# Patient Record
Sex: Male | Born: 1955 | Race: White | Hispanic: No | Smoking: Current every day smoker
Health system: Southern US, Community
[De-identification: ages and names within clinical notes are randomized; demographics above are authoritative.]

## PROBLEM LIST (undated history)

## (undated) DIAGNOSIS — Z972 Presence of dental prosthetic device (complete) (partial): Secondary | ICD-10-CM

## (undated) DIAGNOSIS — I252 Old myocardial infarction: Secondary | ICD-10-CM

## (undated) DIAGNOSIS — N503 Cyst of epididymis: Secondary | ICD-10-CM

## (undated) DIAGNOSIS — Z86010 Personal history of colonic polyps: Secondary | ICD-10-CM

## (undated) DIAGNOSIS — Z955 Presence of coronary angioplasty implant and graft: Secondary | ICD-10-CM

## (undated) DIAGNOSIS — I255 Ischemic cardiomyopathy: Secondary | ICD-10-CM

## (undated) DIAGNOSIS — I10 Essential (primary) hypertension: Secondary | ICD-10-CM

## (undated) DIAGNOSIS — E785 Hyperlipidemia, unspecified: Secondary | ICD-10-CM

## (undated) DIAGNOSIS — I444 Left anterior fascicular block: Secondary | ICD-10-CM

## (undated) DIAGNOSIS — R0989 Other specified symptoms and signs involving the circulatory and respiratory systems: Secondary | ICD-10-CM

## (undated) DIAGNOSIS — J410 Simple chronic bronchitis: Secondary | ICD-10-CM

## (undated) DIAGNOSIS — J439 Emphysema, unspecified: Secondary | ICD-10-CM

## (undated) DIAGNOSIS — N138 Other obstructive and reflux uropathy: Secondary | ICD-10-CM

## (undated) DIAGNOSIS — I2510 Atherosclerotic heart disease of native coronary artery without angina pectoris: Secondary | ICD-10-CM

## (undated) DIAGNOSIS — Z860101 Personal history of adenomatous and serrated colon polyps: Secondary | ICD-10-CM

## (undated) DIAGNOSIS — N401 Enlarged prostate with lower urinary tract symptoms: Secondary | ICD-10-CM

## (undated) DIAGNOSIS — N529 Male erectile dysfunction, unspecified: Secondary | ICD-10-CM

## (undated) DIAGNOSIS — R3912 Poor urinary stream: Secondary | ICD-10-CM

## (undated) DIAGNOSIS — K08109 Complete loss of teeth, unspecified cause, unspecified class: Secondary | ICD-10-CM

## (undated) DIAGNOSIS — Z973 Presence of spectacles and contact lenses: Secondary | ICD-10-CM

## (undated) HISTORY — DX: Essential (primary) hypertension: I10

## (undated) HISTORY — PX: CORONARY ANGIOPLASTY WITH STENT PLACEMENT: SHX49

## (undated) HISTORY — DX: Ischemic cardiomyopathy: I25.5

## (undated) HISTORY — PX: CARDIOVASCULAR STRESS TEST: SHX262

## (undated) HISTORY — DX: Hyperlipidemia, unspecified: E78.5

## (undated) HISTORY — PX: CARDIAC CATHETERIZATION: SHX172

## (undated) HISTORY — DX: Other specified symptoms and signs involving the circulatory and respiratory systems: R09.89

## (undated) HISTORY — DX: Atherosclerotic heart disease of native coronary artery without angina pectoris: I25.10

---

## 1986-05-04 HISTORY — PX: LUMBAR LAMINECTOMY: SHX95

## 1998-01-20 ENCOUNTER — Emergency Department (HOSPITAL_COMMUNITY): Admission: EM | Admit: 1998-01-20 | Discharge: 1998-01-20 | Payer: Self-pay | Admitting: Emergency Medicine

## 1998-03-27 ENCOUNTER — Emergency Department (HOSPITAL_COMMUNITY): Admission: EM | Admit: 1998-03-27 | Discharge: 1998-03-27 | Payer: Self-pay | Admitting: Emergency Medicine

## 2007-12-01 DIAGNOSIS — R0989 Other specified symptoms and signs involving the circulatory and respiratory systems: Secondary | ICD-10-CM

## 2007-12-01 HISTORY — DX: Other specified symptoms and signs involving the circulatory and respiratory systems: R09.89

## 2008-01-06 ENCOUNTER — Encounter: Admission: RE | Admit: 2008-01-06 | Discharge: 2008-01-06 | Payer: Self-pay | Admitting: Cardiovascular Disease

## 2008-01-11 ENCOUNTER — Ambulatory Visit (HOSPITAL_COMMUNITY): Admission: RE | Admit: 2008-01-11 | Discharge: 2008-01-12 | Payer: Self-pay | Admitting: Cardiovascular Disease

## 2008-01-11 DIAGNOSIS — Z955 Presence of coronary angioplasty implant and graft: Secondary | ICD-10-CM

## 2008-01-11 HISTORY — DX: Presence of coronary angioplasty implant and graft: Z95.5

## 2009-01-14 DIAGNOSIS — I2510 Atherosclerotic heart disease of native coronary artery without angina pectoris: Secondary | ICD-10-CM

## 2009-01-14 HISTORY — DX: Atherosclerotic heart disease of native coronary artery without angina pectoris: I25.10

## 2010-05-01 IMAGING — CR DG CHEST 2V
2 series · 2 of 2 positions shown · non-contrast
Comparison: None

CLINICAL DATA: SOB; pre-procedure.  Smoker.

CHEST - 2 VIEW

[view not recorded (1 of 2)]
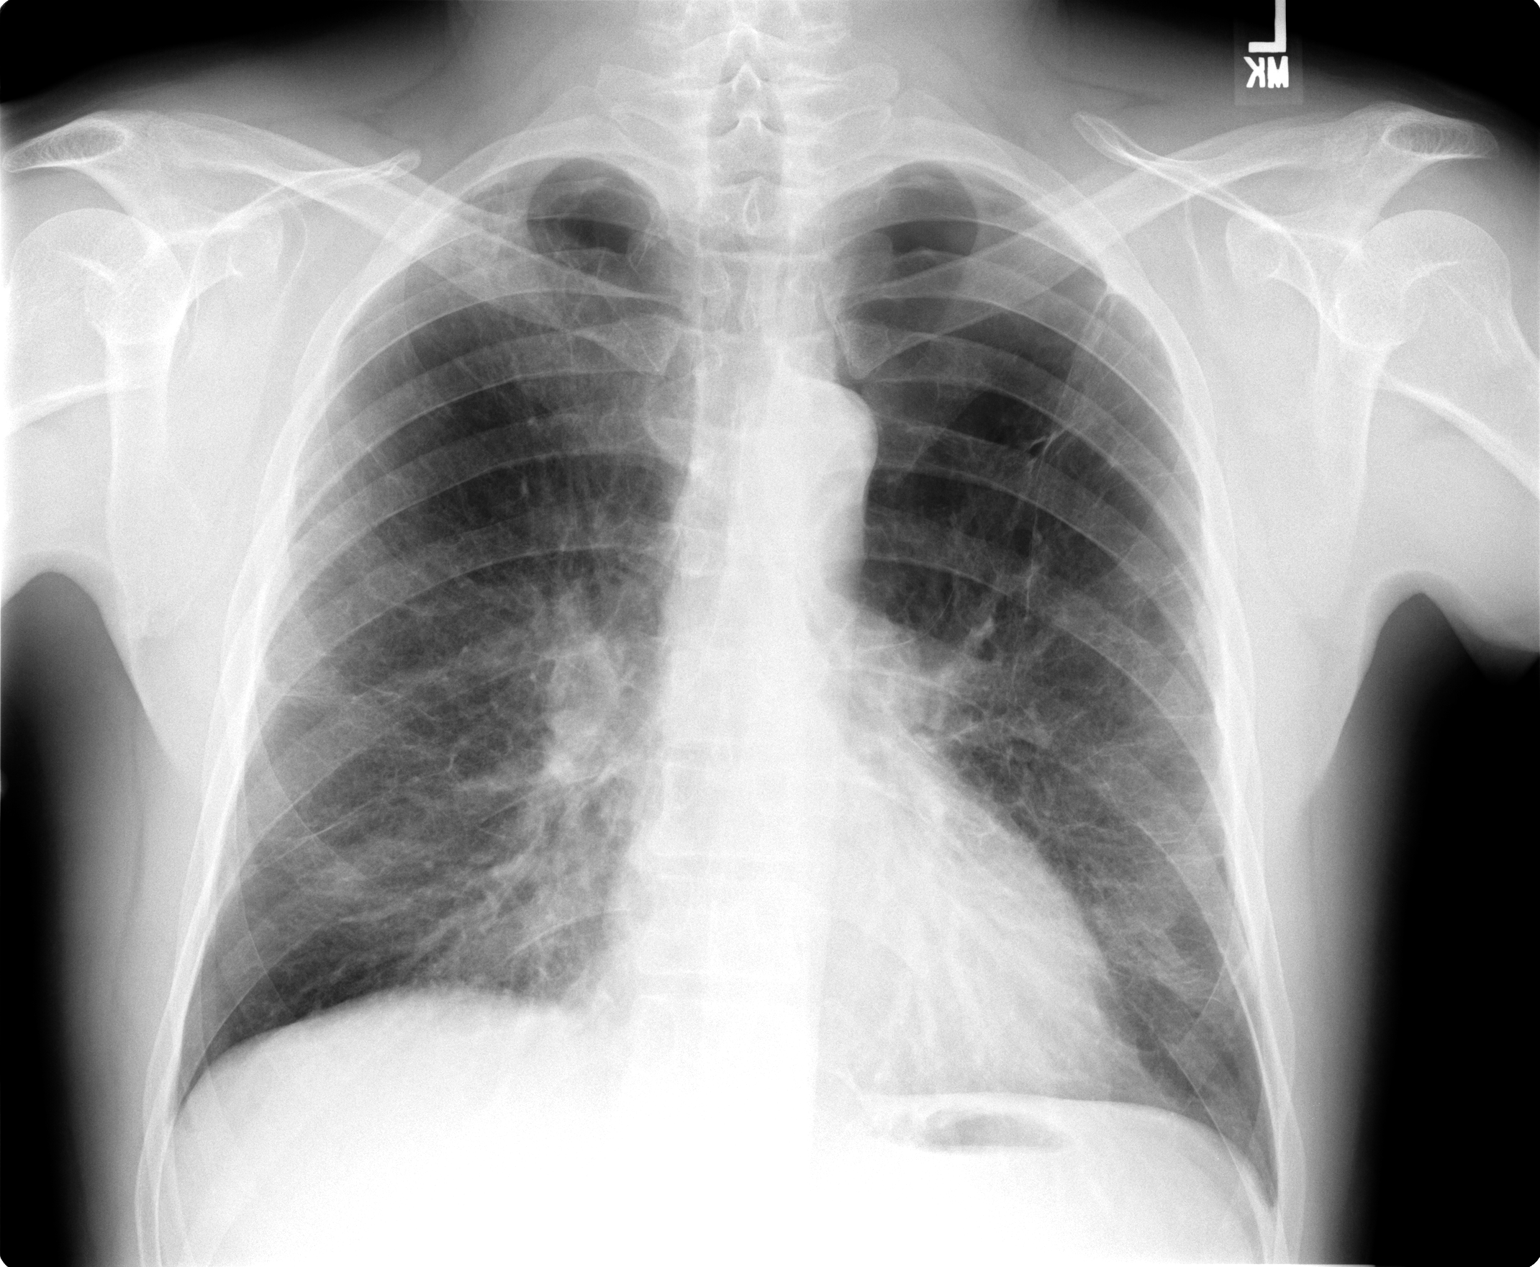

[view not recorded (2 of 2)]
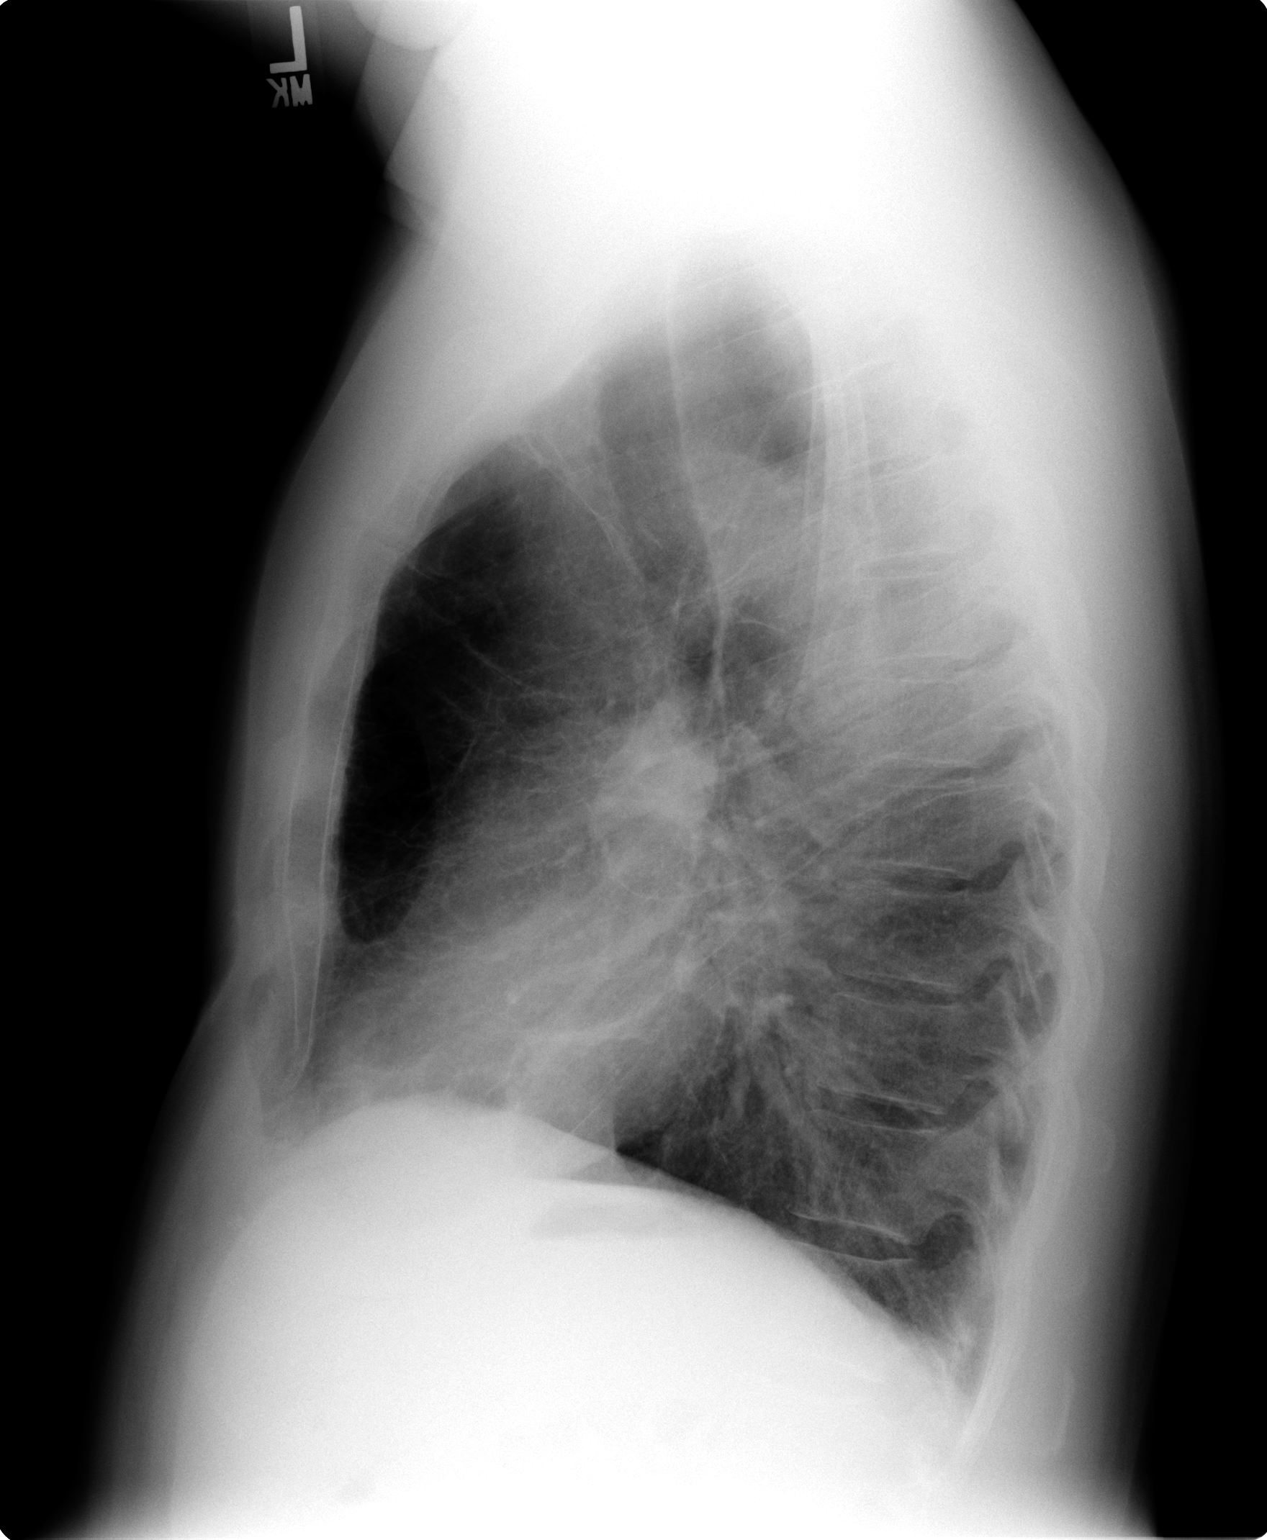

[2 of 2 positions shown; findings below may reference images not displayed]

FINDINGS: COPD/emphysema.  Linear scarring left upper lung zone.
Probable bullae in the left apex.  Cardiac size within normal
limits.  No acute chest findings.  Bony thorax intact
IMPRESSION: COPD/emphysema.

## 2010-09-16 NOTE — Discharge Summary (Signed)
NAME:  EDYN, QAZI NO.:  [id number]   MEDICAL RECORD NO.:  [id number]          PATIENT TYPE:  OIB   LOCATION:  6527                         FACILITY:  [facility]   PHYSICIAN:  Nanetta Batty, M.D.   DATE OF BIRTH:  [birth year]   DATE OF ADMISSION:  01/11/2008  DATE OF DISCHARGE:  01/12/2008                               DISCHARGE SUMMARY   DISCHARGE DIAGNOSES:  1. Coronary artery disease.  Apparently, Mr. Shakai Dolley was seen      as an outpatient by Dr. Nanetta Batty, referred by Dr. Raquel James,      his primary care doctor for an abnormal EKG.  He went on to have a      Myoview test which was positive for inferior lateral ischemia.  He      went on to undergo a cardiac catheterization and he had positive      coronary artery disease as an outpatient.  He has been totally      asymptomatic.  He came into the hospital on January 11, 2008, for      elective PCI.  He had a Promus 2.5 x 12 mm stent placed to his mid      circumflex with results of 90% stenosis decreased to 0 and he had a      Promus stent 2.5 x 15 placed to his proximal RCA, had decreased      from 80% to 0.  He also had a PCI to his OM-1, decreased less than      20%.  2. Hyperlipidemia.  3. Hypertension.  4. Tobacco smoking.  5. Chronic obstructive pulmonary disease/emphysema noted on chest x-      ray on January 06, 2008.   LABORATORY DATA:  Hemoglobin 13.9, hematocrit 41, WBCs 9.7, and  platelets 261.  Sodium 139, potassium 4.0, chloride was 68, CO2 of 25,  BUN 15, creatinine 0.88, and glucose 97.  CK-MB and troponin  postprocedure are negative.  Calcium was 8.5.  Chest x-ray on January 06, 2008, showed COPD and emphysema.   PROCEDURES:  On January 11, 2008, elective catheter with percutaneous  coronary intervention done by Dr. Nanetta Batty with results as above.   DISCHARGE MEDICATIONS:  1. Aspirin 81 mg 2 per day.  2. Simvastatin 20 mg at bedtime once a day.  3. Lisinopril 10 mg  once a day.  4. Plavix 75 mg once a day.  5. Coreg 3.125 mg twice a day.   He was told not to stop his Plavix.  He was also given a prescription  for nitroglycerin 1/150 one under tongue every 5 minutes x3 if needed  for chest pain.   HOSPITAL COURSE:  Mr. Haddix was admitted on January 11, 2008, for  elective PCI of known coronary artery disease, status post cardiac  catheterization as an outpatient at [facility].  He  subsequently underwent stenting to his mid circumflex with a Promus  stent 2.5 x 12 mm and stenting to his proximal RCA 2.5 x 15 mm.  Please  see Dr. Clayborne Dana complete dictation.  It also  appears he may  have had a PCI to an OM-1, reduced from 90 to less than 20.  Postprocedure, the morning of his discharge, his blood pressure was  139/80, heart rate was 93, temperature was 98.7, and room air sats is  94%.  Labs looked all appropriate.  He was seen by Dr. Nanetta Batty  and considered stable for discharge home.  He will be followed up as an  outpatient and seen by Dr. Allyson Sabal on January 17, 2008.  He was told not  to do any strenuous activity, lifting, pushing, pulling, or exercise or  extended walking for 1 week.  He should not return to work until seen by  Dr. Allyson Sabal and if he has any problems he will call our  office.  It is recommended that his Coreg be increased as an outpatient.  He was seen by cardiac rehab.  He was referred to cardiac rehab phase II  and he was seen by the smoking cessation nurse who spoke to him about  Chantix, which he apparently has at home but has not begun to take it.      Lezlie Octave, N.P.      Nanetta Batty, M.D.  Electronically Signed    BB/MEDQ  D:  01/12/2008  T:  01/13/2008  Job:  [id number]   cc:   Ursula Beath, MD

## 2010-09-16 NOTE — Cardiovascular Report (Signed)
NAME:  Tyrone Stanton, Tyrone Stanton NO.:  [id number]   MEDICAL RECORD NO.:  [id number]          PATIENT TYPE:  OIB   LOCATION:  6527                         FACILITY:  [facility]   PHYSICIAN:  Nanetta Batty, M.D.   DATE OF BIRTH:  09/25/1955   DATE OF PROCEDURE:  01/11/2008  DATE OF DISCHARGE:                            CARDIAC CATHETERIZATION   Mr. Kutz is a very pleasant 55 year old recently separated Caucasian  male with no children who works as a Charity fundraiser.  He was  initially referred by Dr. Raquel James for cardiovascular evaluation because  of an abnormal EKG and auscultated bruit.   His cardiac risk factor profile is positive for 50-70-pack-year history  of tobacco abuse.  His father did die of an MI at age 76.  The patient  has never had a heart attack or stroke.  He is relatively asymptomatic.  A Myoview showed anteroapical scar with mild inferior ischemia.  Cath at  [facility] did show 3-vessel disease with 60% segmental mid  LAD, which was calcified, 80-90% mid circumflex, and 90% ostial dominant  right with an EF of 40-45%.  He did have a 70% proximal right renal  artery stenosis.  He presents now for PCI stenting of his circumflex and  right coronary artery with anticipated medical treatment of his LAD.   PROCEDURE DESCRIPTION:  The patient was brought to the second floor  [facility] in the postabsorptive state.  He was  premedicated with p.o. Valium and IV fentanyl.  His right groin was  prepped and shaved in usual sterile fashion.  A 1% Xylocaine was used  for local anesthesia.  A 7-French sheath was inserted into the right  femoral artery using standard Seldinger technique.  A 7-French XB 3.5  guiding catheter along with an 0.014 x 190 Asahi soft wire.  A 2.25 x 10  cutting balloon was used for initial atherectomy of the mid circumflex.  Visipaque dye was used for the entirety of the case.  Aortic pressures  were monitored  during the case.  The patient did receive 200 mcg of  intracoronary nitroglycerin several times during the case.  He was on  aspirin and Plavix and received an additional 300 mg of p.o. Plavix and  IV Pepcid.  He received Angiomax bolus with an ACT of 271.   Cutting balloon atherectomy was performed in the mid circumflex at  nominal pressures resulting in an excellent angiographic result.  Stenting was performed with a 2.5 x 12 Promus drug-eluting stent up to  14 atmospheres (2.75 mm with resulting reduction of 90% stenosis to 0%  residual).  There was some encroachment at the ostium of the small  first marginal branch.  The wire was then redirected down this branch,  and low-pressure inflation was performed with a 206 apex balloon  resulting in a favorable angiographic result and notes 20-30% residual  stenosis.   The dominant right was then approached with a 6-French no-torque guide,  0.014 x 190 Asahi soft wire.  The right was engaged, wired, and directly  stented with a 2.5  x 15 Promus at 14 atmospheres.  It was postdilated  with a 2.75 x 12 [place] Sprinter up to 14 atmospheres (2.8 mm) resulting in  reduction of 80-90% proximal dominant RCA stenosis to 0% residual.  The  patient tolerated the procedure well.  The guidewire and catheter  removed.  The sheath was sewn securely in place.  The patient left the  lab in stable condition.  Angiomax was turned off.  The sheath will be  removed in several hours.  The patient will be treated with aspirin and  Plavix, discharge to home in the morning, and will see me back in the  office in approximately 2 weeks in followup.  Dr. Ursula Beath was  notified of these results.      Nanetta Batty, M.D.  Electronically Signed     JB/MEDQ  D:  01/11/2008  T:  01/12/2008  Job:  [id number]   cc:   [facility]  [facility]  Murrell Redden, MD

## 2010-12-12 DIAGNOSIS — I255 Ischemic cardiomyopathy: Secondary | ICD-10-CM

## 2010-12-12 HISTORY — DX: Ischemic cardiomyopathy: I25.5

## 2011-02-04 LAB — BASIC METABOLIC PANEL
BUN: 15
CO2: 25
Calcium: 8.5
Chloride: 106
Creatinine, Ser: 0.88
GFR calc Af Amer: >60
GFR calc non Af Amer: >60
Glucose, Bld: 97
Potassium: 4
Sodium: 139

## 2011-02-04 LAB — CBC
HCT: 41
Hemoglobin: 13.9
MCHC: 33.8
MCV: 89.5
Platelets: 261
RBC: 4.59
RDW: 13.6
WBC: 9.7

## 2011-02-04 LAB — CARDIAC PANEL(CRET KIN+CKTOT+MB+TROPI)
CK, MB: 2
Relative Index: INVALID
Total CK: 49
Troponin I: 0.04

## 2012-07-03 ENCOUNTER — Encounter: Payer: Self-pay | Admitting: *Deleted

## 2012-08-02 ENCOUNTER — Encounter: Payer: Self-pay | Admitting: Internal Medicine

## 2012-10-27 ENCOUNTER — Encounter: Payer: Self-pay | Admitting: *Deleted

## 2012-10-27 ENCOUNTER — Other Ambulatory Visit: Payer: Self-pay | Admitting: *Deleted

## 2012-10-27 ENCOUNTER — Encounter: Payer: Self-pay | Admitting: Internal Medicine

## 2012-10-27 ENCOUNTER — Ambulatory Visit (INDEPENDENT_AMBULATORY_CARE_PROVIDER_SITE_OTHER): Payer: BC Managed Care – PPO | Admitting: Internal Medicine

## 2012-10-27 VITALS — BP 132/60 | HR 62 | Ht 72.0 in | Wt 185.3 lb

## 2012-10-27 DIAGNOSIS — E785 Hyperlipidemia, unspecified: Secondary | ICD-10-CM | POA: Insufficient documentation

## 2012-10-27 DIAGNOSIS — R0989 Other specified symptoms and signs involving the circulatory and respiratory systems: Secondary | ICD-10-CM

## 2012-10-27 DIAGNOSIS — I2510 Atherosclerotic heart disease of native coronary artery without angina pectoris: Secondary | ICD-10-CM

## 2012-10-27 DIAGNOSIS — I2589 Other forms of chronic ischemic heart disease: Secondary | ICD-10-CM

## 2012-10-27 DIAGNOSIS — I255 Ischemic cardiomyopathy: Secondary | ICD-10-CM | POA: Insufficient documentation

## 2012-10-27 DIAGNOSIS — I10 Essential (primary) hypertension: Secondary | ICD-10-CM

## 2012-10-27 MED ORDER — NITROGLYCERIN 0.4 MG SL SUBL: 0.4000 mg | SUBLINGUAL_TABLET | SUBLINGUAL | Status: DC | PRN

## 2012-10-27 MED ORDER — SIMVASTATIN 80 MG PO TABS: 80.0000 mg | ORAL_TABLET | Freq: Every day | ORAL | Status: DC

## 2012-10-27 MED ORDER — LISINOPRIL 20 MG PO TABS: 20.0000 mg | ORAL_TABLET | Freq: Every day | ORAL | Status: DC

## 2012-10-27 MED ORDER — CARVEDILOL 6.25 MG PO TABS: 6.2500 mg | ORAL_TABLET | Freq: Two times a day (BID) | ORAL | Status: DC

## 2012-10-27 MED ORDER — CLOPIDOGREL BISULFATE 75 MG PO TABS: 75.0000 mg | ORAL_TABLET | Freq: Every day | ORAL | Status: DC

## 2012-10-27 NOTE — Patient Instructions (Addendum)
Your physician recommends that you schedule a follow-up appointment in: 1 year.  Your physician has requested that you have en exercise stress myoview. For further information please visit [email or web address] Please follow instruction sheet, as given.  Your physician has requested that you have a carotid duplex. This test is an ultrasound of the carotid arteries in your neck. It looks at blood flow through these arteries that supply the brain with blood. Allow one hour for this exam. There are no restrictions or special instructions.  Your physician recommends that you return for lab work within the next month. You will need to be fasting for this bloodwork.  NMR with lipid, CMET

## 2012-10-27 NOTE — Progress Notes (Signed)
OFFICE NOTE  Chief Complaint:  Routine followup, DOT physical  Primary Care Physician: No primary provider on file.  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 He has a history of a large anteroapical infarct in the past with a low EF of less than 40%, actually 39% at his last stress test and New York Heart Association Class I-II symptoms. He continues to be generally asymptomatic and can do a high level of exercise. He is here today again for DOT physical and underwent exercise nuclear stress testing on December 12, 2010, with 8 minutes of exercise and 10 metabolic equivalents. The stress test demonstrated a moderate to severe infarct in the apical anterior, apical basal, inferoseptal, inferior, mid-inferoseptal, mid-inferior, and apical inferior regions, which is thought to be unchanged from his previous study. He describes no new cardiovascular symptoms. He is requesting another stress test per DOT guidelines.   Past Medical History  Diagnosis Date  . Myocardial infarction      Myoview stress test 12/2007 showed mild anterior scar and inferior ischemia  . Ischemic cardiomyopathy 12/12/2010    last Exercise stress test-EF 37% mod. to severe perfusion defect to infarct \\scar  with mild perinfarct ischemia -apical anterior,apical basal inferioseptal, basal inferior, mid inferoseptal, mid inferior and apical inferior regions. the post stess LV  normal.;New York Heart Assoc. class l and II symptoms  . Coronary artery disease 01/14/2009    last Echo-45-50%,LV normal  . Hypertension   . Dyslipidemia   . Carotid bruit 12/01/2007    asymptomatic ;Carotid dopler normal    Past Surgical History  Procedure Laterality Date  . Cardiac catheterization  12/29/2007    LAD-long segmental 50 to 60% after the first mod. diagonal branch btwn ST1 and ST2; Lge OM2 80% proximal; RCA,RV branch 80 to 90% mid; RCA gave off PDA and PLA  appeared   to be 90% at the origin   . Coronary angioplasty with stent placement  09/092009    Cutting balloon atherectomy Mid Circ 2.5x12 Promus; Ostium of the 1st marginal branch angoiplasty result 20-30%;RCA stented 2.5 x 15 Promus post dilated w/ 2.75x12 [place] Sprinter reduce80-90% proximal dominanat RCAto 0%     FAMHx:  Family History  Problem Relation Age of Onset  . Stroke Mother   . Cancer Mother   . Heart disease Father     SOCHx:   reports that he has been smoking Cigarettes.  He has a 60 pack-year smoking history. He has never used smokeless tobacco. He reports that he does not drink alcohol or use illicit drugs.  ALLERGIES:  No Known Allergies  ROS: A comprehensive review of systems was negative except for: Cardiovascular: positive for fatigue  HOME MEDS: Current Outpatient Prescriptions  Medication Sig Dispense Refill  . aspirin EC 81 MG tablet Take 81 mg by mouth daily.      . carvedilol (COREG) 6.25 MG tablet Take 1 tablet (6.25 mg total) by mouth 2 (two) times daily.  30 tablet  11  . clopidogrel (PLAVIX) 75 MG tablet Take 1 tablet (75 mg total) by mouth daily.  30 tablet  11  . lisinopril (PRINIVIL,ZESTRIL) 20 MG tablet Take 1 tablet (20 mg total) by mouth daily.  30 tablet  11  . nitroGLYCERIN (NITROSTAT) 0.4 MG SL tablet Place 1 tablet (0.4 mg total) under the tongue every 5 (five) minutes as needed for chest pain.  25 tablet  3  .  simvastatin (ZOCOR) 80 MG tablet Take 1 tablet (80 mg total) by mouth at bedtime.  30 tablet  11   No current facility-administered medications for this visit.    LABS/IMAGING: No results found for this or any previous visit (from the past 48 hour(s)). No results found.  VITALS: BP 132/60  Pulse 62  Ht 6' (1.829 m)  Wt 185 lb 4.8 oz (84.052 kg)  BMI 25.13 kg/m2  EXAM: General appearance: alert and no distress Neck: no adenopathy, no JVD, supple, symmetrical, trachea midline, thyroid not enlarged, symmetric, no tenderness/mass/nodules  and New right carotid bruit Lungs: clear to auscultation bilaterally Heart: regular rate and rhythm, S1, S2 normal, no murmur, click, rub or gallop Abdomen: soft, non-tender; bowel sounds normal; no masses,  no organomegaly Extremities: extremities normal, atraumatic, no cyanosis or edema Pulses: 2+ and symmetric Skin: Skin color, texture, turgor normal. No rashes or lesions Neurologic: Grossly normal  EKG: Normal sinus rhythm at 62, Q waves in 2,3 and aVF.  ASSESSMENT: 1. Ischemic cardiomyopathy, EF 37%, with inferior and apical scar, NYHA Class I symptoms 2. Hypertension 3. Hyperlipidemia 4. Continued tobacco abuse  PLAN: 1.   Mr. Tyrone Stanton continues to do fairly well and is not limited to his duties by heart failure.  Has been 2 years since his last stress test, and per DOT guidelines, he is due for a repeat.  There is also a newly discovered right carotid bruit today. I did review his records which indicated no significant carotid stenosis bilaterally by ultrasound in 2009. He will need a repeat carotid ultrasound today. In addition, would like to recheck a lipid profile and metabolic profile. We may need to uptitrate his cholesterol medications. We also had a lengthy conversation about smoking cessation, spending more than 3 minutes discussing ways that he could quit. I don't feel that he is ready to do that at this time, but offered many options to help him with that. He understands that this is in a large part responsible for the worsening of his carotid artery disease.  Plan is to see him annually and I will contact him with the results of his stress test and Dopplers.  Tyrone Nose, MD, [facility] Attending Cardiologist [facility]  HILTY,Tyrone Stanton 10/27/2012, 9:30 AM

## 2012-11-11 ENCOUNTER — Telehealth (HOSPITAL_COMMUNITY): Payer: Self-pay | Admitting: Internal Medicine

## 2012-11-11 NOTE — Telephone Encounter (Signed)
[employer] denied stress test for this patient's DOT physical. We submitted an appeal and [employer] upheld the denial since the patient is asymptomatic and his last stress test was less than 3 years ago. I called the patient to notify, he is aware we are cancelling due to insurance. I informed him that he may contact the health plan for a member initiated appeal and we will be happy to assist. I also informed him that it may help to contact his DOT physician who also may be able to provide assistance.

## 2012-11-17 ENCOUNTER — Encounter (HOSPITAL_COMMUNITY): Payer: BC Managed Care – PPO

## 2012-11-17 ENCOUNTER — Ambulatory Visit: Payer: Self-pay | Admitting: Family Medicine

## 2012-11-17 ENCOUNTER — Ambulatory Visit (HOSPITAL_COMMUNITY)
Admission: RE | Admit: 2012-11-17 | Discharge: 2012-11-17 | Disposition: A | Discharge status: Home / Self Care | Payer: BC Managed Care – PPO | Source: Ambulatory Visit | Attending: Internal Medicine | Admitting: Internal Medicine

## 2012-11-17 VITALS — BP 136/88 | HR 82 | Temp 97.7°F | Resp 18 | Ht 72.0 in | Wt 182.0 lb

## 2012-11-17 DIAGNOSIS — Z0289 Encounter for other administrative examinations: Secondary | ICD-10-CM

## 2012-11-17 DIAGNOSIS — R0989 Other specified symptoms and signs involving the circulatory and respiratory systems: Secondary | ICD-10-CM | POA: Insufficient documentation

## 2012-11-17 NOTE — Progress Notes (Signed)
Carotid Duplex Completed. °Tyrone Stanton ° °

## 2012-11-17 NOTE — Progress Notes (Signed)
Subjective:    Patient ID: Tyrone Stanton, male    DOB: 16-Jan-1956, 57 y.o.   MRN: [id number]  HPI Tyrone Stanton is a 57 y.o. male Here for DOT physical. Last card good until 12/17/12. 2 years.  Last card good for  Hx of CAD, s/p ptca in 12/2007: takes plavix, no bleeding problems.    Nuclear stress testing 12/12/10 - , unchanged from previous study (stress test demonstrated a moderate to severe infarct in the apical anterior, apical basal, inferoseptal, inferior, mid-inferoseptal, mid-inferior, and apical inferior regions, which is thought to be unchanged from his previous study).   01/2009 echo - EF 45-50%.   followed by cardiologist - Dr. Rennis Golden, - OV 10/27/12. Has not had repeat stress test due to cost. Told that insurance would not pay for stress test unless acute problem.  Carotid bruit on R noted by cardiologist - had doppler done this am.   No chest pain, no lightheadedness or dizziness with driving. Has not had to take NTG.   tob abuse - 1.5 ppd for 40 years. .  No other medical problems.   L testicle swelling - some increased recently - no hernia in past, but told by urologist few years ago this area was ok - plans to followup with urologist as area has increased in size.    Review of Systems  Constitutional: Negative for fever and chills.  Respiratory: Positive for shortness of breath (shortnes sof breath with smoking - if with exertion.  no recent changes. ). Negative for chest tightness.   Cardiovascular: Negative for chest pain, palpitations and leg swelling.  Neurological: Negative for dizziness, weakness, light-headedness and headaches.       Objective:   Physical Exam  Vitals reviewed. Constitutional: He is oriented to person, place, and time. He appears well-developed and well-nourished. No distress.  HENT:  Head: Normocephalic and atraumatic.  Right Ear: External ear normal.  Left Ear: External ear normal.  Mouth/Throat: Oropharynx is clear and  moist.  Eyes: Conjunctivae and EOM are normal. Pupils are equal, round, and reactive to light.  Neck: Trachea normal and normal range of motion. Neck supple. Carotid bruit is present (faint bruit on Right. ). No thyromegaly present.  Cardiovascular: Normal rate, regular rhythm, normal heart sounds and intact distal pulses.   Pulmonary/Chest: Effort normal and breath sounds normal. No respiratory distress. He has no wheezes. He has no rales.  Abdominal: Soft. He exhibits no distension. There is no tenderness. Hernia confirmed negative in the right inguinal area and confirmed negative in the left inguinal area.  Genitourinary: Prostate normal. Left testis shows swelling (enlarged L testicle,).  Musculoskeletal: Normal range of motion. He exhibits no edema and no tenderness.  Lymphadenopathy:    He has no cervical adenopathy.  Neurological: He is alert and oriented to person, place, and time. He has normal reflexes.  Skin: Skin is warm and dry.  Psychiatric: He has a normal mood and affect. His behavior is normal.   Repeat BP 136/88.     Assessment & Plan:  Tyrone Stanton is a 57 y.o. male   DOT physical -  Hx of CAD, MI by prior stress testing.  Most recent echo in 2010 - EF 45-50 %, and stress testing in 12/2010. Due for repeat stress testing.  Will also need clearance letter form cardiologist before clearance for maximum of 1 year card. No card given today d/t above. When returns with this documentation, I can complete paperwork which was held  today.   Hx of tobacco abuse, and DOE with climbing steep hills only. No cough/cough syncope or known hx of COPD.  O2 sat wnl. Restrictions to be determined as above.   L testicle swelling - told prior was normal ?spermatocoele.  To follow up with urologist as subjective increase in size.    Patient Instructions  We will need a recent stress test and clearance letter from cardiologist, including verification that your "ejection fraction" is  greater than 40%,  prior to completing clearance with maximum 1 year card.  We will also need a copy of your carotid artery test today.   follow up with urologist for the testicle swelling.

## 2012-11-17 NOTE — Patient Instructions (Signed)
We will need a recent stress test and clearance letter from cardiologist, including verification that your "ejection fraction" is greater than 40%,  prior to completing clearance with maximum 1 year card.  We will also need a copy of your carotid artery test today.   follow up with urologist for the testicle swelling.

## 2012-11-30 ENCOUNTER — Encounter: Payer: Self-pay | Admitting: *Deleted

## 2013-04-22 ENCOUNTER — Other Ambulatory Visit: Payer: Self-pay | Admitting: Internal Medicine

## 2013-04-24 NOTE — Telephone Encounter (Signed)
Rx was sent to pharmacy electronically. 

## 2013-06-10 ENCOUNTER — Other Ambulatory Visit: Payer: Self-pay | Admitting: Internal Medicine

## 2013-06-12 NOTE — Telephone Encounter (Signed)
Rx was sent to pharmacy electronically. 

## 2013-06-17 ENCOUNTER — Other Ambulatory Visit: Payer: Self-pay | Admitting: Internal Medicine

## 2013-06-19 NOTE — Telephone Encounter (Signed)
Rx was sent to pharmacy electronically. 

## 2013-10-17 ENCOUNTER — Other Ambulatory Visit: Payer: Self-pay | Admitting: Internal Medicine

## 2013-10-17 NOTE — Telephone Encounter (Signed)
Rx was sent to pharmacy electronically. 

## 2013-10-24 ENCOUNTER — Ambulatory Visit: Payer: BC Managed Care – PPO | Admitting: Internal Medicine

## 2013-10-28 ENCOUNTER — Other Ambulatory Visit: Payer: Self-pay | Admitting: Internal Medicine

## 2013-10-30 NOTE — Telephone Encounter (Signed)
Rx was sent to pharmacy electronically. 

## 2013-11-10 ENCOUNTER — Other Ambulatory Visit: Payer: Self-pay | Admitting: Internal Medicine

## 2013-11-10 NOTE — Telephone Encounter (Signed)
Rx refill sent to patient pharmacy   

## 2013-11-30 ENCOUNTER — Encounter: Payer: Self-pay | Admitting: Internal Medicine

## 2013-11-30 ENCOUNTER — Ambulatory Visit (INDEPENDENT_AMBULATORY_CARE_PROVIDER_SITE_OTHER): Payer: BC Managed Care – PPO | Admitting: Internal Medicine

## 2013-11-30 ENCOUNTER — Other Ambulatory Visit: Payer: Self-pay | Admitting: *Deleted

## 2013-11-30 VITALS — BP 136/78 | HR 59 | Ht 72.0 in | Wt 184.4 lb

## 2013-11-30 DIAGNOSIS — I2589 Other forms of chronic ischemic heart disease: Secondary | ICD-10-CM

## 2013-11-30 DIAGNOSIS — N528 Other male erectile dysfunction: Secondary | ICD-10-CM

## 2013-11-30 DIAGNOSIS — N529 Male erectile dysfunction, unspecified: Secondary | ICD-10-CM

## 2013-11-30 DIAGNOSIS — I255 Ischemic cardiomyopathy: Secondary | ICD-10-CM

## 2013-11-30 DIAGNOSIS — I25119 Atherosclerotic heart disease of native coronary artery with unspecified angina pectoris: Secondary | ICD-10-CM

## 2013-11-30 DIAGNOSIS — E785 Hyperlipidemia, unspecified: Secondary | ICD-10-CM

## 2013-11-30 DIAGNOSIS — I10 Essential (primary) hypertension: Secondary | ICD-10-CM

## 2013-11-30 DIAGNOSIS — Z79899 Other long term (current) drug therapy: Secondary | ICD-10-CM

## 2013-11-30 DIAGNOSIS — I2510 Atherosclerotic heart disease of native coronary artery without angina pectoris: Secondary | ICD-10-CM

## 2013-11-30 DIAGNOSIS — I209 Angina pectoris, unspecified: Secondary | ICD-10-CM

## 2013-11-30 DIAGNOSIS — N400 Enlarged prostate without lower urinary tract symptoms: Secondary | ICD-10-CM

## 2013-11-30 MED ORDER — SIMVASTATIN 80 MG PO TABS: ORAL_TABLET | ORAL | Status: DC

## 2013-11-30 MED ORDER — CLOPIDOGREL BISULFATE 75 MG PO TABS: ORAL_TABLET | ORAL | Status: DC

## 2013-11-30 MED ORDER — NITROGLYCERIN 0.4 MG SL SUBL: 0.4000 mg | SUBLINGUAL_TABLET | SUBLINGUAL | Status: DC | PRN

## 2013-11-30 MED ORDER — CARVEDILOL 6.25 MG PO TABS: ORAL_TABLET | ORAL | Status: DC

## 2013-11-30 MED ORDER — SILDENAFIL CITRATE 50 MG PO TABS: 50.0000 mg | ORAL_TABLET | Freq: Every day | ORAL | Status: DC | PRN

## 2013-11-30 MED ORDER — LISINOPRIL 20 MG PO TABS: ORAL_TABLET | ORAL | Status: DC

## 2013-11-30 NOTE — Progress Notes (Signed)
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 He has a history of a large anteroapical infarct in the past with a low EF of less than 40%, actually 39% at his last stress test and New York Heart Association Class I-II symptoms. He continues to be generally asymptomatic and can do a high level of exercise. He is here today again for DOT physical and underwent exercise nuclear stress testing on December 12, 2010, with 8 minutes of exercise and 10 metabolic equivalents. The stress test demonstrated a moderate to severe infarct in the apical anterior, apical basal, inferoseptal, inferior, mid-inferoseptal, mid-inferior, and apical inferior regions, which is thought to be unchanged from his previous study. He describes no new cardiovascular symptoms. He is requesting another stress test per DOT guidelines.   Tyrone Stanton returns today for his annual visit. At his last office visit I recommended another stress test however it was declined by his insurance company. Probably because he is asymptomatic however it was requested per DOT guidelines which are not up-to-date. He continues to feel very well and does high level of work and exercise. He denies any chest pain or shortness of breath. We have not reassess his LV function and over 3 years and is due for repeat echocardiography. He is also not had a lipid assessment and over a year.  Past Medical History  Diagnosis Date  . Myocardial infarction      Myoview stress test 12/2007 showed mild anterior scar and inferior ischemia  . Ischemic cardiomyopathy 12/12/2010    last Exercise stress test-EF 37% mod. to severe perfusion defect to infarct \\scar  with mild perinfarct ischemia -apical anterior,apical basal inferioseptal, basal inferior, mid inferoseptal, mid inferior and  apical inferior regions. the post stess LV  normal.;New York Heart Assoc. class l and II symptoms  . Coronary artery disease 01/14/2009    last Echo-45-50%,LV normal  . Hypertension   . Dyslipidemia   . Carotid bruit 12/01/2007    asymptomatic ;Carotid dopler normal    Past Surgical History  Procedure Laterality Date  . Cardiac catheterization  12/29/2007    LAD-long segmental 50 to 60% after the first mod. diagonal branch btwn ST1 and ST2; Lge OM2 80% proximal; RCA,RV branch 80 to 90% mid; RCA gave off PDA and PLA  appeared  to be 90% at the origin   . Coronary angioplasty with stent placement  09/092009    Cutting balloon atherectomy Mid Circ 2.5x12 Promus; Ostium of the 1st marginal branch angoiplasty result 20-30%;RCA stented 2.5 x 15 Promus post dilated w/ 2.75x12 [place] Sprinter reduce80-90% proximal dominanat RCAto 0%     FAMHx:  Family History  Problem Relation Age of Onset  . Stroke Mother   . Cancer Mother   . Heart disease Father     SOCHx:   reports that he has been smoking Cigarettes.  He has a 60 pack-year smoking history. He has never used smokeless tobacco. He reports that he does not drink alcohol or use illicit drugs.  ALLERGIES:  No Known Allergies  ROS: A comprehensive review of systems was negative.  HOME MEDS: Current Outpatient Prescriptions  Medication Sig Dispense Refill  . aspirin EC 81 MG tablet Take 81 mg by mouth daily.      . carvedilol (COREG) 6.25 MG tablet TAKE 1 TABLET  BY MOUTH TWICE A DAY  180 tablet  3  . clopidogrel (PLAVIX) 75 MG tablet TAKE 1 TABLET BY MOUTH EVERY DAY  90 tablet  3  . lisinopril (PRINIVIL,ZESTRIL) 20 MG tablet TAKE 1 TABLET BY MOUTH DAILY  90 tablet  3  . nitroGLYCERIN (NITROSTAT) 0.4 MG SL tablet Place 1 tablet (0.4 mg total) under the tongue every 5 (five) minutes as needed for chest pain.  25 tablet  1  . simvastatin (ZOCOR) 80 MG tablet TAKE 1 TABLET BY MOUTH AT BEDTIME  90 tablet  3   No current facility-administered  medications for this visit.    LABS/IMAGING: No results found for this or any previous visit (from the past 48 hour(s)). No results found.  VITALS: BP 136/78  Pulse 59  Ht 6' (1.829 m)  Wt 184 lb 6.4 oz (83.643 kg)  BMI 25.00 kg/m2  EXAM: General appearance: alert and no distress Neck: no adenopathy, no JVD, supple, symmetrical, trachea midline, thyroid not enlarged, symmetric, no tenderness/mass/nodules and New right carotid bruit Lungs: clear to auscultation bilaterally Heart: regular rate and rhythm, S1, S2 normal, no murmur, click, rub or gallop Abdomen: soft, non-tender; bowel sounds normal; no masses,  no organomegaly Extremities: extremities normal, atraumatic, no cyanosis or edema Pulses: 2+ and symmetric Skin: Skin color, texture, turgor normal. No rashes or lesions Neurologic: Grossly normal  EKG: Sinus bradycardia at 59, old inferior infarct  ASSESSMENT: 1. Ischemic cardiomyopathy, EF 37%, with inferior and apical scar, NYHA Class I symptoms 2. Hypertension 3. Hyperlipidemia 4. Continued tobacco abuse 5. ED 6. BPH  PLAN: 1.   Tyrone Stanton unfortunately continues to smoke. This is an ongoing risk factor for him. His blood pressure is well controlled and is on appropriate heart failure medications. His EF has been assessed in over 3 years. I would recommend an echocardiogram based on current guidelines. He is cholesterol he is overdue for a reassessment. We will check that today. He continues to exercise at a fairly high level without any new chest pain which is reassuring. Plan to see him back annually or sooner if there is significant change on electrocardiogram.  Finally at the end of the visit today Tyrone Stanton reported is having problems with erectile dysfunction. He is interested in samples of Viagra. I was able to provide does however cautioned him against using them with nitrates. He is to use nitrates the last several years. He also reports a history of BPH. Will  go ahead and check a PSA along with his labs and I've asked him to make another appointment to see the urologist at [facility] who he had previously seen. He does not have a primary care provider and I strongly encouraged him to get one.  Tyrone NoseKenneth C. Hilty, MD, [facility] Attending Cardiologist [facility]  Stanton,Tyrone C 11/30/2013, 9:03 AM

## 2013-11-30 NOTE — Patient Instructions (Signed)
Your physician has requested that you have an echocardiogram. Echocardiography is a painless test that uses sound waves to create images of your heart. It provides your doctor with information about the size and shape of your heart and how well your heart's chambers and valves are working. This procedure takes approximately one hour. There are no restrictions for this procedure.  Your physician recommends that you return for lab work at your convenience. You will need to be fasting.   Your physician wants you to follow-up in: 1 year with Dr. Rennis GoldenHilty. You will receive a reminder letter in the mail two months in advance. If you don't receive a letter, please call our office to schedule the follow-up appointment.  Dr. Rennis GoldenHilty encourages you to establish with a primary care provider - [facility] in [place]

## 2013-11-30 NOTE — Telephone Encounter (Signed)
Rx was sent to pharmacy electronically. 

## 2013-12-08 ENCOUNTER — Telehealth (HOSPITAL_COMMUNITY): Payer: Self-pay | Admitting: *Deleted

## 2013-12-09 LAB — COMPREHENSIVE METABOLIC PANEL
ALT: 12 U/L (ref 0–53)
AST: 16 U/L (ref 0–37)
Albumin: 4.2 g/dL (ref 3.5–5.2)
Alkaline Phosphatase: 59 U/L (ref 39–117)
BUN: 9 mg/dL (ref 6–23)
CO2: 22 mEq/L (ref 19–32)
Calcium: 9.2 mg/dL (ref 8.4–10.5)
Chloride: 106 mEq/L (ref 96–112)
Creat: 0.75 mg/dL (ref 0.50–1.35)
Glucose, Bld: 102 mg/dL — ABNORMAL HIGH (ref 70–99)
Potassium: 4.5 mEq/L (ref 3.5–5.3)
Sodium: 138 mEq/L (ref 135–145)
Total Bilirubin: 0.5 mg/dL (ref 0.2–1.2)
Total Protein: 6.3 g/dL (ref 6.0–8.3)

## 2013-12-09 LAB — LIPID PANEL
Cholesterol: 138 mg/dL (ref 0–200)
HDL: 34 mg/dL — ABNORMAL LOW (ref >39)
LDL Cholesterol: 81 mg/dL (ref 0–99)
Total CHOL/HDL Ratio: 4.1 Ratio
Triglycerides: 115 mg/dL (ref <150)
VLDL: 23 mg/dL (ref 0–40)

## 2013-12-11 LAB — PSA: PSA: 1.84 ng/mL (ref <=4.00)

## 2013-12-12 ENCOUNTER — Encounter: Payer: Self-pay | Admitting: *Deleted

## 2013-12-19 ENCOUNTER — Other Ambulatory Visit (HOSPITAL_COMMUNITY): Payer: Self-pay | Admitting: *Deleted

## 2013-12-19 DIAGNOSIS — I429 Cardiomyopathy, unspecified: Secondary | ICD-10-CM

## 2014-01-11 ENCOUNTER — Other Ambulatory Visit (INDEPENDENT_AMBULATORY_CARE_PROVIDER_SITE_OTHER): Payer: BC Managed Care – PPO

## 2014-01-11 DIAGNOSIS — I2510 Atherosclerotic heart disease of native coronary artery without angina pectoris: Secondary | ICD-10-CM

## 2014-01-11 DIAGNOSIS — I2589 Other forms of chronic ischemic heart disease: Secondary | ICD-10-CM

## 2014-01-11 DIAGNOSIS — I429 Cardiomyopathy, unspecified: Secondary | ICD-10-CM

## 2014-03-13 ENCOUNTER — Other Ambulatory Visit: Payer: Self-pay

## 2014-03-13 MED ORDER — SILDENAFIL CITRATE 50 MG PO TABS: 50.0000 mg | ORAL_TABLET | Freq: Every day | ORAL | Status: DC | PRN

## 2014-03-13 NOTE — Telephone Encounter (Signed)
Rx was sent to pharmacy electronically. 

## 2014-05-23 ENCOUNTER — Other Ambulatory Visit: Payer: Self-pay | Admitting: Internal Medicine

## 2014-05-25 ENCOUNTER — Other Ambulatory Visit: Payer: Self-pay | Admitting: Internal Medicine

## 2014-11-13 ENCOUNTER — Other Ambulatory Visit: Payer: Self-pay | Admitting: Internal Medicine

## 2014-11-30 ENCOUNTER — Ambulatory Visit (INDEPENDENT_AMBULATORY_CARE_PROVIDER_SITE_OTHER): Payer: BLUE CROSS/BLUE SHIELD | Admitting: Internal Medicine

## 2014-11-30 ENCOUNTER — Encounter: Payer: Self-pay | Admitting: Internal Medicine

## 2014-11-30 ENCOUNTER — Telehealth: Payer: Self-pay | Admitting: *Deleted

## 2014-11-30 VITALS — BP 152/88 | HR 72 | Ht 72.0 in | Wt 190.6 lb

## 2014-11-30 DIAGNOSIS — I255 Ischemic cardiomyopathy: Secondary | ICD-10-CM | POA: Diagnosis not present

## 2014-11-30 DIAGNOSIS — Z79899 Other long term (current) drug therapy: Secondary | ICD-10-CM

## 2014-11-30 DIAGNOSIS — I2510 Atherosclerotic heart disease of native coronary artery without angina pectoris: Secondary | ICD-10-CM | POA: Diagnosis not present

## 2014-11-30 DIAGNOSIS — N400 Enlarged prostate without lower urinary tract symptoms: Secondary | ICD-10-CM | POA: Diagnosis not present

## 2014-11-30 DIAGNOSIS — I10 Essential (primary) hypertension: Secondary | ICD-10-CM

## 2014-11-30 DIAGNOSIS — N529 Male erectile dysfunction, unspecified: Secondary | ICD-10-CM | POA: Diagnosis not present

## 2014-11-30 DIAGNOSIS — I2583 Coronary atherosclerosis due to lipid rich plaque: Secondary | ICD-10-CM

## 2014-11-30 DIAGNOSIS — E785 Hyperlipidemia, unspecified: Secondary | ICD-10-CM

## 2014-11-30 MED ORDER — CARVEDILOL 12.5 MG PO TABS: 12.5000 mg | ORAL_TABLET | Freq: Two times a day (BID) | ORAL | Status: DC

## 2014-11-30 MED ORDER — SILDENAFIL CITRATE 20 MG PO TABS: 40.0000 mg | ORAL_TABLET | Freq: Every day | ORAL | Status: DC | PRN

## 2014-11-30 NOTE — Addendum Note (Signed)
Addended by: Lindell Spar on: 11/30/2014 10:17 AM   Modules accepted: Orders

## 2014-11-30 NOTE — Patient Instructions (Addendum)
Medication Instructions:   INCREASE carvedilol to 12.5mg  twice daily A prescription for sildenafil  tablets - take 2-3 tablets daily as needed has been sent to the pharmacy  Labwork:  Please have fasting lab work at [facility].   Testing/Procedures:  Echocardiogram in [place] in 1 year  Follow-Up:  1 year with Dr. Rennis Golden  Any Other Special Instructions Will Be Listed Below (If Applicable).  Dr. Rennis Golden has referred you to [name] with [facility]   Dr. Rennis Golden recommends that you contact [facility] to establish with a primary care provider.

## 2014-11-30 NOTE — Progress Notes (Signed)
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 He has a history of a large anteroapical infarct in the past with a low EF of less than 40%, actually 39% at his last stress test and New York Heart Association Class I-II symptoms. He continues to be generally asymptomatic and can do a high level of exercise. He is here today again for DOT physical and underwent exercise nuclear stress testing on December 12, 2010, with 8 minutes of exercise and 10 metabolic equivalents. The stress test demonstrated a moderate to severe infarct in the apical anterior, apical basal, inferoseptal, inferior, mid-inferoseptal, mid-inferior, and apical inferior regions, which is thought to be unchanged from his previous study. He describes no new cardiovascular symptoms. He is requesting another stress test per DOT guidelines.   Tyrone Stanton returns today for his annual visit. At his last office visit I recommended another stress test however it was declined by his insurance company. Probably because he is asymptomatic however it was requested per DOT guidelines which are not up-to-date. He continues to feel very well and does high level of work and exercise. He denies any chest pain or shortness of breath. We have not reassess his LV function and over 3 years and is due for repeat echocardiography. He is also not had a lipid assessment and over a year.  Tyrone Stanton returns today for follow-up. At his last office visit he had reassessment of LV function by echo which showed EF stable at 40%. Fortunately continues to smoke. 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 He continues to have problems with erectile dysfunction but seems to be well-controlled with Viagra. He is not  yet established a primary care provider. He is previously seen a urologist [name], but has not followed up.  Past Medical History  Diagnosis Date  . Myocardial infarction      Myoview stress test 12/2007 showed mild anterior scar and inferior ischemia  . Ischemic cardiomyopathy 12/12/2010    last Exercise stress test-EF 37% mod. to severe perfusion defect to infarct \\scar  with mild perinfarct ischemia -apical anterior,apical basal inferioseptal, basal inferior, mid inferoseptal, mid inferior and apical inferior regions. the post stess LV  normal.;New York Heart Assoc. class l and II symptoms  . Coronary artery disease 01/14/2009    last Echo-45-50%,LV normal  . Hypertension   . Dyslipidemia   . Carotid bruit 12/01/2007    asymptomatic ;Carotid dopler normal    Past Surgical History  Procedure Laterality Date  . Cardiac catheterization  12/29/2007    LAD-long segmental 50 to 60% after the first mod. diagonal branch btwn ST1 and ST2; Lge OM2 80% proximal; RCA,RV branch 80 to 90% mid; RCA gave off PDA and PLA  appeared  to be 90% at the origin   . Coronary angioplasty with stent placement  09/092009    Cutting balloon atherectomy Mid Circ 2.5x12 Promus; Ostium of the 1st marginal branch angoiplasty result 20-30%;RCA stented 2.5 x 15 Promus post dilated w/ 2.75x12 [place] Sprinter reduce80-90% proximal dominanat RCAto 0%     FAMHx:  Family History  Problem Relation Age of Onset  . Stroke Mother   . Cancer Mother   . Heart disease Father     SOCHx:  reports that he has been smoking Cigarettes.  He has a 60 pack-year smoking history. He has never used smokeless tobacco. He reports that he does not drink alcohol or use illicit drugs.  ALLERGIES:  No Known Allergies  ROS: A comprehensive review of systems was negative.  HOME MEDS: Current Outpatient Prescriptions  Medication Sig Dispense Refill  . aspirin EC 81 MG tablet Take 81 mg by mouth daily.    . carvedilol (COREG) 12.5 MG  tablet Take 1 tablet (12.5 mg total) by mouth 2 (two) times daily. 60 tablet 11  . clopidogrel (PLAVIX) 75 MG tablet TAKE 1 TABLET BY MOUTH EVERY DAY 90 tablet 3  . lisinopril (PRINIVIL,ZESTRIL) 20 MG tablet TAKE 1 TABLET BY MOUTH DAILY 90 tablet 3  . nitroGLYCERIN (NITROSTAT) 0.4 MG SL tablet Place 1 tablet (0.4 mg total) under the tongue every 5 (five) minutes as needed for chest pain. 25 tablet 1  . simvastatin (ZOCOR) 80 MG tablet TAKE 1 TABLET BY MOUTH AT BEDTIME 90 tablet 3  . sildenafil (REVATIO) 20 MG tablet Take 2-3 tablets (40-60 mg total) by mouth daily as needed. 30 tablet 1   No current facility-administered medications for this visit.    LABS/IMAGING: No results found for this or any previous visit (from the past 48 hour(s)). No results found.  VITALS: BP 152/88 mmHg  Pulse 72  Ht 6' (1.829 m)  Wt 190 lb 9.6 oz (86.456 kg)  BMI 25.84 kg/m2  EXAM: General appearance: alert and no distress Neck: no adenopathy, no JVD, supple, symmetrical, trachea midline, thyroid not enlarged, symmetric, no tenderness/mass/nodules and New right carotid bruit Lungs: clear to auscultation bilaterally Heart: regular rate and rhythm, S1, S2 normal, no murmur, click, rub or gallop Abdomen: soft, non-tender; bowel sounds normal; no masses,  no organomegaly Extremities: extremities normal, atraumatic, no cyanosis or edema Pulses: 2+ and symmetric Skin: Skin color, texture, turgor normal. No rashes or lesions Neurologic: Grossly normal  EKG: Normal sinus rhythm at 72  ASSESSMENT: 1. Ischemic cardiomyopathy, EF 40%, with inferior and apical scar, NYHA Class I symptoms 2. Hypertension 3. Hyperlipidemia 4. Continued tobacco abuse 5. ED 6. BPH  PLAN: 1.   Tyrone Stanton unfortunately continues to smoke. This is an ongoing risk factor for him. His blood pressure is well controlled and is on appropriate heart failure medications. His EF appears to be stable at 40%. There is room to up titrate  his medications and I like to increase his carvedilol to 12.5 mg twice a day. He is due for repeat cholesterol Stanton, as well as general screening labs and a PSA. He's requesting repeat pills of his Viagra. He needs to see his urologist and will refer him. I've also given him a referral to [facility] as he needs to establish with a primary care provider. We will recheck an echocardiogram prior to his next visit in one year.  Chrystie Nose, MD, [facility] Attending Cardiologist [facility]  Chrystie Nose 11/30/2014, 8:46 AM

## 2014-11-30 NOTE — Telephone Encounter (Signed)
Faxed prior authorization for sildenafil  tablets to [employer]-

## 2014-12-04 ENCOUNTER — Encounter: Payer: Self-pay | Admitting: Internal Medicine

## 2014-12-04 ENCOUNTER — Telehealth: Payer: Self-pay | Admitting: Internal Medicine

## 2014-12-04 ENCOUNTER — Other Ambulatory Visit: Payer: Self-pay | Admitting: *Deleted

## 2014-12-04 DIAGNOSIS — N400 Enlarged prostate without lower urinary tract symptoms: Secondary | ICD-10-CM

## 2014-12-04 DIAGNOSIS — N529 Male erectile dysfunction, unspecified: Secondary | ICD-10-CM

## 2014-12-04 MED ORDER — SILDENAFIL CITRATE 20 MG PO TABS: 40.0000 mg | ORAL_TABLET | Freq: Every day | ORAL | Status: DC | PRN

## 2014-12-04 NOTE — Telephone Encounter (Signed)
Spoke with [employer] rep - sildenafil  tablets is only on their formulary for [facility]  5-10mg  is approved can be used for ED (4 -  tabs/month) or BPH (1 -  tab daily)  cialis is on their formulary - may be a cost issue for patient as brand name viagra was changed to sildenafil   Will call patient to see if he would like Rx to go thru [facility] - if not, wil have to consult MD on dose change

## 2014-12-04 NOTE — Telephone Encounter (Signed)
[place]  Lindell Spar, RN            He is scheduled with Dr. Patsi Sears on 01-23-15. I mailed the appt date and details to him as he had no VM set up on his phone.   [employer]

## 2014-12-04 NOTE — Telephone Encounter (Signed)
Returning your call from today. °

## 2014-12-04 NOTE — Telephone Encounter (Signed)
Spoke with patient and informed him of [employer] medication [place] status. Offered to send Rx to [facility] - he would like this. Rx(s) sent to pharmacy electronically.  Informed him of urology appointment and informed him he will get a letter in the mail

## 2014-12-05 ENCOUNTER — Telehealth: Payer: Self-pay | Admitting: Internal Medicine

## 2014-12-05 NOTE — Telephone Encounter (Signed)
8/3 s/w patient and he needs to check with insurance company. States that insurance is suppose to be changing in Sept..2016. He wants to wait and call the office back.  Order was put in by Dr. Leone Payor for Echo to be done in the [facility].

## 2014-12-06 ENCOUNTER — Other Ambulatory Visit: Payer: Self-pay | Admitting: Internal Medicine

## 2014-12-06 NOTE — Telephone Encounter (Signed)
Rx(s) sent to pharmacy electronically.  

## 2014-12-06 NOTE — Telephone Encounter (Signed)
REFILL 

## 2015-01-07 ENCOUNTER — Other Ambulatory Visit: Payer: Self-pay | Admitting: Internal Medicine

## 2015-01-24 LAB — COMPREHENSIVE METABOLIC PANEL
ALT: 14 U/L (ref 9–46)
AST: 17 U/L (ref 10–35)
Albumin: 4 g/dL (ref 3.6–5.1)
Alkaline Phosphatase: 54 U/L (ref 40–115)
BUN: 14 mg/dL (ref 7–25)
CO2: 26 mmol/L (ref 20–31)
Calcium: 9.3 mg/dL (ref 8.6–10.3)
Chloride: 104 mmol/L (ref 98–110)
Creat: 0.76 mg/dL (ref 0.70–1.25)
Glucose, Bld: 100 mg/dL — ABNORMAL HIGH (ref 65–99)
Potassium: 4.6 mmol/L (ref 3.5–5.3)
Sodium: 139 mmol/L (ref 135–146)
Total Bilirubin: 0.5 mg/dL (ref 0.2–1.2)
Total Protein: 6.4 g/dL (ref 6.1–8.1)

## 2015-01-24 LAB — CBC
HCT: 45.8 % (ref 39.0–52.0)
Hemoglobin: 15.7 g/dL (ref 13.0–17.0)
MCH: 30.6 pg (ref 26.0–34.0)
MCHC: 34.3 g/dL (ref 30.0–36.0)
MCV: 89.3 fL (ref 78.0–100.0)
MPV: 9.9 fL (ref 8.6–12.4)
Platelets: 281 10*3/uL (ref 150–400)
RBC: 5.13 MIL/uL (ref 4.22–5.81)
RDW: 13.4 % (ref 11.5–15.5)
WBC: 7.7 10*3/uL (ref 4.0–10.5)

## 2015-01-24 LAB — LIPID PANEL
Cholesterol: 122 mg/dL — ABNORMAL LOW (ref 125–200)
HDL: 31 mg/dL — ABNORMAL LOW (ref >=40)
LDL Cholesterol: 71 mg/dL (ref <130)
Total CHOL/HDL Ratio: 3.9 Ratio (ref <=5.0)
Triglycerides: 99 mg/dL (ref <150)
VLDL: 20 mg/dL (ref <30)

## 2015-01-24 LAB — PSA: PSA: 1.57 ng/mL (ref <=4.00)

## 2015-03-23 ENCOUNTER — Other Ambulatory Visit: Payer: Self-pay | Admitting: Internal Medicine

## 2015-03-25 ENCOUNTER — Telehealth: Payer: Self-pay

## 2015-03-25 NOTE — Telephone Encounter (Signed)
Prior auth for Sildenafil 20mg  tabs [facility].

## 2015-03-27 ENCOUNTER — Telehealth: Payer: Self-pay

## 2015-03-27 ENCOUNTER — Other Ambulatory Visit: Payer: Self-pay | Admitting: Internal Medicine

## 2015-03-27 DIAGNOSIS — I429 Cardiomyopathy, unspecified: Secondary | ICD-10-CM

## 2015-03-27 NOTE — Telephone Encounter (Signed)
Prior auth for Sildenafil denied by [employer] Spoke with patient to advise him of this. He is already seeing a urologist and getting penile injections, so we will not pursue an appeal.

## 2015-04-18 ENCOUNTER — Other Ambulatory Visit: Payer: Self-pay

## 2015-04-18 ENCOUNTER — Ambulatory Visit (INDEPENDENT_AMBULATORY_CARE_PROVIDER_SITE_OTHER): Payer: 59

## 2015-04-18 DIAGNOSIS — I429 Cardiomyopathy, unspecified: Secondary | ICD-10-CM | POA: Diagnosis not present

## 2015-04-18 HISTORY — PX: TRANSTHORACIC ECHOCARDIOGRAM: SHX275

## 2015-11-22 ENCOUNTER — Encounter: Payer: Self-pay | Admitting: Internal Medicine

## 2015-11-22 ENCOUNTER — Ambulatory Visit (INDEPENDENT_AMBULATORY_CARE_PROVIDER_SITE_OTHER): Payer: 59 | Admitting: Internal Medicine

## 2015-11-22 VITALS — BP 134/82 | HR 64 | Ht 72.0 in | Wt 188.8 lb

## 2015-11-22 DIAGNOSIS — I2583 Coronary atherosclerosis due to lipid rich plaque: Secondary | ICD-10-CM

## 2015-11-22 DIAGNOSIS — Z1211 Encounter for screening for malignant neoplasm of colon: Secondary | ICD-10-CM | POA: Diagnosis not present

## 2015-11-22 DIAGNOSIS — I2510 Atherosclerotic heart disease of native coronary artery without angina pectoris: Secondary | ICD-10-CM

## 2015-11-22 DIAGNOSIS — I10 Essential (primary) hypertension: Secondary | ICD-10-CM

## 2015-11-22 DIAGNOSIS — Z720 Tobacco use: Secondary | ICD-10-CM | POA: Diagnosis not present

## 2015-11-22 DIAGNOSIS — F17200 Nicotine dependence, unspecified, uncomplicated: Secondary | ICD-10-CM

## 2015-11-22 DIAGNOSIS — E785 Hyperlipidemia, unspecified: Secondary | ICD-10-CM

## 2015-11-22 DIAGNOSIS — I255 Ischemic cardiomyopathy: Secondary | ICD-10-CM

## 2015-11-22 MED ORDER — SILDENAFIL CITRATE 20 MG PO TABS: 40.0000 mg | ORAL_TABLET | Freq: Every day | ORAL | Status: DC | PRN

## 2015-11-22 NOTE — Progress Notes (Signed)
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 He has a history of a large anteroapical infarct in the past with a low EF of less than 40%, actually 39% at his last stress test and New York Heart Association Class I-II symptoms. He continues to be generally asymptomatic and can do a high level of exercise. He is here today again for DOT physical and underwent exercise nuclear stress testing on December 12, 2010, with 8 minutes of exercise and 10 metabolic equivalents. The stress test demonstrated a moderate to severe infarct in the apical anterior, apical basal, inferoseptal, inferior, mid-inferoseptal, mid-inferior, and apical inferior regions, which is thought to be unchanged from his previous study. He describes no new cardiovascular symptoms. He is requesting another stress test per DOT guidelines.   Tyrone Stanton returns today for his annual visit. At his last office visit I recommended another stress test however it was declined by his insurance company. Probably because he is asymptomatic however it was requested per DOT guidelines which are not up-to-date. He continues to feel very well and does high level of work and exercise. He denies any chest pain or shortness of breath. We have not reassess his LV function and over 3 years and is due for repeat echocardiography. He is also not had a lipid assessment and over a year.  Tyrone Stanton returns today for follow-up. At his last office visit he had reassessment of LV function by echo which showed EF stable at 40%. Fortunately continues to smoke. 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 He continues to have problems with erectile dysfunction but seems to be well-controlled with Viagra. He is not  yet established a primary care provider. He is previously seen a urologist Dr. Brunilda PayorNesi, but has not followed up.  11/22/2015  Tyrone Stanton returns today for follow-up. Over the past year he's had no new complaints. He continues to work long days and has no problems with chest pain or worsening shortness of breath. He is in need of a refill of his Revatio. He is not establish with a primary care provider. I asked him whether he never had screening colonoscopy and he has not yet. I will therefore refer him for that. 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 He's not had repeat imaging since that time. He continues to smoke about 1-1/2 packs per day. I reviewed current screening guidelines which indicate a recommendation for a CT scan of the chest and smokers that are in their 60s. I'm recommending him to get a CT scan to screen for early malignancies.  Past Medical History  Diagnosis Date  . Myocardial infarction [facility](HCC)      Myoview stress test 12/2007 showed mild anterior scar and inferior ischemia  . Ischemic cardiomyopathy 12/12/2010    last Exercise stress test-EF 37% mod. to severe perfusion defect to infarct \\scar  with mild perinfarct ischemia -apical anterior,apical basal inferioseptal, basal inferior, mid inferoseptal, mid inferior and apical inferior regions. the post stess LV  normal.;New York Heart Assoc. class l and II symptoms  . Coronary artery disease 01/14/2009    last Echo-45-50%,LV normal  . Hypertension   . Dyslipidemia   . Carotid bruit  12/01/2007    asymptomatic ;Carotid dopler normal    Past Surgical History  Procedure Laterality Date  . Cardiac catheterization  12/29/2007    LAD-long segmental 50 to 60% after the first mod. diagonal branch btwn ST1 and ST2; Lge OM2 80% proximal; RCA,RV branch 80 to 90% mid; RCA gave off PDA and PLA  appeared  to be 90% at the origin   . Coronary angioplasty with stent placement   09/092009    Cutting balloon atherectomy Mid Circ 2.5x12 Promus; Ostium of the 1st marginal branch angoiplasty result 20-30%;RCA stented 2.5 x 15 Promus post dilated w/ 2.75x12 [place] reduce80-90% proximal dominanat RCAto 0%     FAMHx:  Family History  Problem Relation Age of Onset  . Stroke Mother   . Cancer Mother   . Heart disease Father     SOCHx:   reports that he has been smoking Cigarettes.  He has a 60 pack-year smoking history. He has never used smokeless tobacco. He reports that he does not drink alcohol or use illicit drugs.  ALLERGIES:  No Known Allergies  ROS: Pertinent items noted in HPI and remainder of comprehensive ROS otherwise negative.  HOME MEDS: Current Outpatient Prescriptions  Medication Sig Dispense Refill  . aspirin EC 81 MG tablet Take 81 mg by mouth daily.    . carvedilol (COREG) 12.5 MG tablet Take 1 tablet by mouth daily.  11  . clopidogrel (PLAVIX) 75 MG tablet TAKE 1 TABLET BY MOUTH EVERY DAY 90 tablet 3  . lisinopril (PRINIVIL,ZESTRIL) 20 MG tablet TAKE 1 TABLET BY MOUTH DAILY 90 tablet 3  . nitroGLYCERIN (NITROSTAT) 0.4 MG SL tablet Place 1 tablet (0.4 mg total) under the tongue every 5 (five) minutes as needed for chest pain. 25 tablet 1  . sildenafil (REVATIO) 20 MG tablet Take 2-3 tablets (40-60 mg total) by mouth daily as needed. 50 tablet 0  . simvastatin (ZOCOR) 80 MG tablet TAKE 1 TABLET BY MOUTH AT BEDTIME 90 tablet 3   No current facility-administered medications for this visit.    LABS/IMAGING: No results found for this or any previous visit (from the past 48 hour(s)). No results found.  VITALS: BP 134/82 mmHg  Pulse 64  Ht 6' (1.829 m)  Wt 188 lb 12.8 oz (85.639 kg)  BMI 25.60 kg/m2  EXAM: General appearance: alert and no distress Neck: no adenopathy, no JVD, supple, symmetrical, trachea midline and right carotid bruit Lungs: diminished breath sounds bilaterally Heart: regular rate and rhythm, S1, S2 normal, no  murmur, click, rub or gallop Abdomen: soft, non-tender; bowel sounds normal; no masses,  no organomegaly Extremities: extremities normal, atraumatic, no cyanosis or edema Pulses: 2+ and symmetric Skin: Skin color, texture, turgor normal. No rashes or lesions Neurologic: Grossly normal  EKG: Normal sinus rhythm with sinus arrhythmia at 64, low voltage QRS, left posterior fascicular block  ASSESSMENT: 1. Ischemic cardiomyopathy, EF 40%, with inferior and apical scar, NYHA Class I symptoms 2. Hypertension 3. Hyperlipidemia 4. Continued tobacco abuse 5. ED 6. BPH 7. Probable COPD  PLAN: 1.   Tyrone Stanton continues to say he feels well and can do high-level of activity without any limitations. Blood pressure is well controlled. Cholesterol has been at goal. A previously identified carotid bruit was assessed in 2014 which showed very minimal right carotid artery stenosis. I reviewed her old chest x-ray from 2009 which was preprocedural demonstrated COPD/emphysematous changes with probable bullae in the left apical area. Given the fact that he continues  to smoke and based on current guidelines and recommending a screening CT for lung cancer and to reassess his COPD. He is not currently on any treatment for COPD. In addition, he is overdue for a screening colonoscopy based on guidelines which are at age 48 for males. I will refer him for that today as he has no primary care provider. He does see a urologist and gets regular prostate exams/PSA tests. I will refill Revatio today as well.  Follow up annually with me.  Chrystie Nose, MD, [facility] Attending Cardiologist [facility]  Chrystie Nose 11/22/2015, 8:33 AM

## 2015-11-22 NOTE — Patient Instructions (Addendum)
Your physician recommends that you continue on your current medications as directed. Please refer to the Current Medication list given to you today.  You have been referred to Dr. Jeani HawkingPatrick Hung - gastroenterologist (screening for colonoscopy)  Non-Cardiac CT scanning (chest CT), (CAT scanning), is a noninvasive, special x-ray that produces cross-sectional images of the body using x-rays and a computer. CT scans help physicians diagnose and treat medical conditions. For some CT exams, a contrast material is used to enhance visibility in the area of the body being studied. CT scans provide greater clarity and reveal more details than regular x-ray exams. -- this [facility] done at [facility] -- address and phone # for imaging center provided to patient to schedule at his convenience     Your physician wants you to follow-up in: 1 year with Dr. Rennis GoldenHilty. You will receive a reminder letter in the mail two months in advance. If you don't receive a letter, please call our office to schedule the follow-up appointment.

## 2015-12-11 ENCOUNTER — Encounter: Payer: Self-pay | Admitting: Internal Medicine

## 2015-12-11 NOTE — Telephone Encounter (Signed)
LM for Morrie Sheldonshley to call back to clarify which meds need to be held - clearance for colonoscopy requests that patient hold carvedilol for 5 days prior (typo - clopidogrel?)

## 2015-12-12 NOTE — Telephone Encounter (Signed)
1. Type of surgery: colonoscopy 2. Date of surgery: 01/28/2016 3. Surgeon: Dr. Elnoria HowardHung 4. Medications that need to be held & how long: request to hold carvedilol for 5 days prior - patient is on plavix & aspirin 5. Fax and/or Phone: (f) [phone]  (p) [phone]

## 2015-12-13 ENCOUNTER — Telehealth: Payer: Self-pay | Admitting: Internal Medicine

## 2015-12-13 NOTE — Telephone Encounter (Signed)
Another encounter created w/correct information provided by office requesting clearance

## 2015-12-13 NOTE — Telephone Encounter (Signed)
1. Type of surgery: colonoscopy 2. Date of surgery: 01/28/2016 3. Surgeon: Dr. Elnoria HowardHung 4. Medications that need to be held & how long: request to hold plavix 5 days prior 5. Fax and/or Phone: (f) [phone]  (p) [phone]

## 2015-12-19 NOTE — Telephone Encounter (Signed)
Ok to hold plavix for 5 days for colonoscopy. Low risk.  Dr. HRexene Edison

## 2015-12-19 NOTE — Telephone Encounter (Signed)
Clearance routed via EPIC to Dr. Hung 

## 2015-12-27 ENCOUNTER — Other Ambulatory Visit: Payer: Self-pay | Admitting: Internal Medicine

## 2015-12-27 NOTE — Telephone Encounter (Signed)
Rx(s) sent to pharmacy electronically.  

## 2016-01-25 ENCOUNTER — Other Ambulatory Visit: Payer: Self-pay | Admitting: Internal Medicine

## 2016-01-28 HISTORY — PX: COLONOSCOPY: SHX174

## 2016-01-28 NOTE — Telephone Encounter (Signed)
Rx(s) sent to pharmacy electronically.  

## 2016-03-10 ENCOUNTER — Other Ambulatory Visit: Payer: Self-pay | Admitting: Internal Medicine

## 2016-03-12 ENCOUNTER — Other Ambulatory Visit: Payer: Self-pay | Admitting: Internal Medicine

## 2016-05-07 ENCOUNTER — Other Ambulatory Visit: Payer: Self-pay | Admitting: Urology

## 2016-05-07 ENCOUNTER — Telehealth: Payer: Self-pay | Admitting: Internal Medicine

## 2016-05-07 NOTE — Telephone Encounter (Signed)
Low risk for surgery. Ok to hold aspirin 7 days and plavix 5 days prior to procedure and restart afterward.  Dr. HRexene Edison

## 2016-05-07 NOTE — Telephone Encounter (Signed)
Clearance routed via EPIC fax.  

## 2016-05-07 NOTE — Telephone Encounter (Signed)
New Message    Request for surgical clearance:  1. What type of surgery is being performed? Left epididymectomy   2. When is this surgery scheduled? Feb 2nd,18  3. Are there any medications that need to be held prior to surgery and how long? Plavix and aspirin   4. Name of physician performing surgery? Dr Marcello Fennelannebaum   5. What is your office phone and fax number? [phone] ext [phone] fax [phone]

## 2016-05-08 MED ORDER — ACETAMINOPHEN 325 MG PO TABS: 1000.0000 mg | ORAL_TABLET | Freq: Four times a day (QID) | ORAL | Status: AC | PRN

## 2016-06-02 ENCOUNTER — Encounter (HOSPITAL_BASED_OUTPATIENT_CLINIC_OR_DEPARTMENT_OTHER): Payer: Self-pay | Admitting: *Deleted

## 2016-06-03 ENCOUNTER — Encounter (HOSPITAL_BASED_OUTPATIENT_CLINIC_OR_DEPARTMENT_OTHER): Payer: Self-pay | Admitting: *Deleted

## 2016-06-03 NOTE — Progress Notes (Signed)
NPO AFTER MN.  ARRIVE AT 0715.  NEEDS ISTAT.   CURRENT EKG IN CHART AND EPIC.  WILL TAKE COREG AM DOS W/  SIPS OF WATER .  WILL DO HIBICLENS SHOWER AM DOS.

## 2016-06-04 NOTE — H&P (Signed)
Office Visit Report     03/31/2016   --------------------------------------------------------------------------------   Tyrone Stanton  MRN: [id number]  PRIMARY CARE:    DOB: [birth year], 61 year old Male  REFERRING:  [place]aly Hilty, MD  SSN:  PROVIDER:  Jethro Bolus, M.D.    LOCATION:  [facility] [phone]   --------------------------------------------------------------------------------   CC: I have an enlarged prostate (follow-up).  HPI: Tyrone Stanton is a 61 year-old male established patient who is here for an enlarged prostate follow-up evaluation.  He is currently on none for the symptoms due to the enlarged prostate gland. He is not on new medications for symptoms of prostate enlargement.   He does not have an abnormal sensation when needing to urinate. He is having problems getting his urine stream started. He does have to strain or bear down to start his urinary stream. He does not have a good size and strength to his urinary stream. He is not having problems with emptying his bladder well. He does not dribble at the end of urination.     CC: I have erectile dysfunction (Meds).  HPI: He is currently on Caverject for treatment of his erectile dysfunction. His medication allows him to achieve good erections.   He does not have trouble maintaining his erection. His erections are straight.   He does not have trouble reaching climax. His sex drive has not decreased.   He states that PEP injections are working well, but having difficulty finding the right dosage amount to use.     CC: I have swelling in my scrotum.  HPI: He first noticed his hydrocele 3 years ago. His hydrocele is on the left side. He does not have pain on the side of his hydrocele. His hydrocele does cause restriction of normal activities.   He has not had injuries to the testicles or scrotum. He has not had scrotal surgery. His hydrocele does bother him enough to consider surgical  repair. He has not had a testicular infection.   3 years of increasing size of left hemiscrotum, thought secondary to benign hydrocele. 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 He is now for scrotal ultrasound.     AUA Symptom Score: He never has the sensation of not emptying his bladder completely after finishing urinating. Less than 50% of the time he has to urinate again fewer than two hours after he has finished urinating. Less than 20% of the time he has to start and stop again several times when he urinates. He never finds it difficult to postpone urination. 50% of the time he has a weak urinary stream. Less than 20% of the time he has to push or strain to begin urination. He never has to get up to urinate from the time he goes to bed until the time he gets up in the morning.   Calculated AUA Symptom Score: 7    IIEF-5 Score: The patient's confidence that he can get an erection is high.     QOL Score: He would feel mostly satisfied if he had to live with his urinary condition the way it is now for the rest of his life.   Calculated QOL Symptom Score: 2    ALLERGIES: No Allergies    MEDICATIONS: Lisinopril 20 mg tablet  Aspirin 81 MG TABS Oral  Carvedilol 12.5 MG Oral Tablet Oral  Clopidogrel Bisulfate 75 MG Oral Tablet Oral  Simvastatin 80 MG Oral Tablet Oral  GU PSH: None     PSH Notes: Cath Stent Placement, Back Surgery   NON-GU PSH: None   GU PMH: Hydrocele, Unspec - 11/26/2015, - 11/26/2015 ED, arterial insufficiency, Erectile dysfunction due to arterial insufficiency - 03/18/2015 Spermatocele of epididymis, Unspec, Spermatocele - 03/11/2015 BPH w/LUTS, Benign prostatic hyperplasia with urinary obstruction - 01/23/2015 Urinary Hesitancy, Hesitancy - 01/23/2015 Weak Urinary Stream, Weak urinary stream - 01/23/2015 Hematuria, Unspec, Hematuria - 2014    NON-GU PMH: Atherosclerotic heart disease of native coronary artery without angina pectoris,  CAD (coronary artery disease) - 01/23/2015 Encounter for general adult medical examination without abnormal findings, Encounter for preventive health examination - 01/23/2015 Myocardial Infarction, History of acute myocardial infarction - 01/23/2015 Personal history of other diseases of the circulatory system, History of hypertension - 01/23/2015 Personal history of other endocrine, nutritional and metabolic disease, History of hypercholesterolemia - 01/23/2015 Personal history of other specified conditions, History of heartburn - 2014    FAMILY HISTORY: cardiac disorder - Runs In Family Death In The Family Father - Runs In Family Family Health Status - Mother's Age - Runs In Family   SOCIAL HISTORY: Marital Status: Single Current Smoking Status: Patient smokes. Has smoked since 11/02/1974. Smokes 2 packs per day.  Has never drank.  Patient's occupation Engineer, agricultural.    REVIEW OF SYSTEMS:    GU Review Male:   Lt hydrocele. Patient reports frequent urination, leakage of urine, and erection problems. Patient denies hard to postpone urination, burning/ pain with urination, get up at night to urinate, stream starts and stops, trouble starting your stream, have to strain to urinate , and penile pain.  Gastrointestinal (Upper):   Patient denies nausea, vomiting, and indigestion/ heartburn.  Gastrointestinal (Lower):   Patient denies diarrhea and constipation.  Constitutional:   Patient denies fever, night sweats, weight loss, and fatigue.  Skin:   Patient denies skin rash/ lesion and itching.  Eyes:   Patient denies blurred vision and double vision.  Ears/ Nose/ Throat:   Patient denies sore throat and sinus problems.  Hematologic/Lymphatic:   Patient denies swollen glands and easy bruising.  Cardiovascular:   Patient denies leg swelling and chest pains.  Respiratory:   Patient denies cough and shortness of breath.  Endocrine:   Patient denies excessive thirst.  Musculoskeletal:   Patient  denies back pain and joint pain.  Neurological:   Patient denies headaches and dizziness.  Psychologic:   Patient denies depression and anxiety.   VITAL SIGNS:      03/31/2016 10:09 AM  BP 122/81 mmHg  Pulse 65 /min  Temperature 97.7 F / 36 C   GU PHYSICAL EXAMINATION:    Anus and Perineum: No hemorrhoids. No anal stenosis. No rectal fissure, no anal fissure. No edema, no dimple, no perineal tenderness, no anal tenderness.  Scrotum: No lesions. No edema. No cysts. No warts.   Epididymides: Right: no spermatocele, no masses, no cysts, no tenderness, no induration, no enlargement. Left: no spermatocele, no masses, no cysts, no tenderness, no induration, no enlargement.  Testes: 5+ cm hydrocele left testis. No tenderness, no swelling, no enlargement left testis. No tenderness, no swelling, no enlargement right testis. Normal location left testis. Normal location right testis. No mass, no cyst, no varicocele left testis. No mass, no cyst, no varicocele, no hydrocele right testis.   Urethral Meatus: Normal size. No lesion, no wart, no discharge, no polyp. Normal location.  Penis: Circumcised, no warts, no cracks. No dorsal Peyronie's plaques, no left corporal Peyronie's  plaques, no right corporal Peyronie's plaques, no scarring, no warts. No balanitis, no meatal stenosis.  Prostate: 40 gram or 2+ size. Left lobe normal consistency, right lobe normal consistency. Symmetrical lobes. No prostate nodule. Left lobe no tenderness, right lobe no tenderness.  Seminal Vesicles: Nonpalpable.  Sphincter Tone: Normal sphincter. No rectal tenderness. No rectal mass.    MULTI-SYSTEM PHYSICAL EXAMINATION:    Constitutional: Well-nourished. No physical deformities. Normally developed. Good grooming.  Neck: Neck symmetrical, not swollen. Normal tracheal position.  Respiratory: No labored breathing, no use of accessory muscles.   Cardiovascular: Normal temperature, normal extremity pulses, no swelling, no  varicosities.  Lymphatic: No enlargement of neck, axillae, groin.  Skin: No paleness, no jaundice, no cyanosis. No lesion, no ulcer, no rash.  Neurologic / Psychiatric: Oriented to time, oriented to place, oriented to person. No depression, no anxiety, no agitation.  Gastrointestinal: No mass, no tenderness, no rigidity, non obese abdomen.  Eyes: Normal conjunctivae. Normal eyelids.  Ears, Nose, Mouth, and Throat: Left ear no scars, no lesions, no masses. Right ear no scars, no lesions, no masses. Nose no scars, no lesions, no masses. Normal hearing. Normal lips.  Musculoskeletal: Normal gait and station of head and neck.     PAST DATA REVIEWED:  Source Of History:  Patient  Records Review:   Previous Patient Records  X-Ray Review: Scrotal Ultrasound: Reviewed Films. Reviewed Report. Discussed With Patient.     11/26/15 01/23/15 12/02/07  PSA  Total PSA 1.47 ng/dl 1.911.59  4.782.58     29/56/2111/28/17  Urinalysis  Urine Appearance Clear   Urine Color Yellow   Urine Glucose Neg   Urine Bilirubin Neg   Urine Ketones Neg   Urine Specific Gravity 1.030   Urine Blood 1+   Urine pH 5.5   Urine Protein Neg   Urine Urobilinogen 1.0   Urine Nitrites Neg    PROCEDURES:         Scrotal Ultrasound - [id number]  Right Testicle: Length: 5.13 cm Height: 2.38cm Width: 4.67cm   Left Testicle: Length: 4.83 cm Height: 2.52cm Width: 4.18cm   Left Testis/Epididymis:  1)Cystic Area in Testicle measuring 1.44cm x 0.98cm x 1.18cm-----2)Multiple Cystic Area in Left Epi Head - Largest measured--a)3.41cm x 2.33cm x 2.64cm--b)3.20cm x 1.17cm x 3.93cm--c)Estimated 5.87cm x 4.18cm x 5.92cm-----3)Several Cystic Areas in Left Epi Tail- Largest measured--a)3.30cm x 2.56cm x 3.74cm--b)1.17cm x 0.88cm x 2.25cm  Right Testis/Epididymis:  1)Cystic Areas within Right Testicle measuring 0.31cm and 0.42cm-----2)Multiple Cystic Areas within Right Epi Tail -Largest measured--a)2.28cm x 1.67cm x 1.61cm--b)1.38cm x 1.35cm x  1.13cm--c)2.21cm x 2.76cm x 2.28cm--d)1.67cm x 1.72cm x 1.32cm--e)2.52cm x 2.77cm x 2.45cm      Bilateral Blood Flow Visualized Small Amount of Fluid seen Bilaterally No Varicocele visualized today  Appearance of Rete Testis Bilaterally         Urinalysis w/Scope Dipstick Dipstick Cont'd Micro  Color: Yellow Bilirubin: Neg WBC/hpf: 0 - 5/hpf  Appearance: Clear Ketones: Neg RBC/hpf: 3 - 10/hpf  Specific Gravity: 1.030 Blood: 1+ Bacteria: NS (Not Seen)  pH: 5.5 Protein: Neg Cystals: NS (Not Seen)  Glucose: Neg Urobilinogen: 1.0 Casts: NS (Not Seen)    Nitrites: Neg Trichomonas: Not Present    Leukocyte Esterase: Neg Mucous: Present      Epithelial Cells: NS (Not Seen)      Yeast: NS (Not Seen)      Sperm: Not Present    ASSESSMENT:      ICD-10 Details  1 GU:   BPH w/LUTS -  N40.1   2   ED, arterial insufficiency - N52.01   3   Hydrocele, Unspec - N43.3           Notes:   the patient has a large left epididymal cysts, which are causing difficulty in his daily activities. The cysts had of the epididymis measured 5.87 x 4.18 x 5.92 cm, and 3.41 x 2.33 x 2.64 cm; and the cysts in his epididymal tail measured reporting 3 x 2.5 x 3.7 cm, and 2.2 x 1.1 x 0.88 cm. It discussed surgery to remove the epididymis on the left hemiscrotum, which would require he had worked for 3 weeks. He will pay hard testicle, 4 proximally 3 weeks. In addition, there is a chance that we will sacrifice the blood supply to the left testicle in removing these large cysts. He will be scheduled for outpatient surgery February.    PLAN:            Medications Refill Meds: Prostaglandin E1 20ug/mL Use as directed   #10  5 Refill(s)            Schedule Return Visit: 3 Months - Schedule Surgery          Document Letter(s):  Created for Patient: Clinical Summary    The information contained in this medical record document is considered private and confidential patient information. This information can only be  used for the medical diagnosis and/or medical services that are being provided by the patient's selected caregivers. This information can only be distributed outside of the patient's care if the patient agrees and signs waivers of authorization for this information to be sent to an outside source or route.

## 2016-06-05 ENCOUNTER — Ambulatory Visit (HOSPITAL_BASED_OUTPATIENT_CLINIC_OR_DEPARTMENT_OTHER): Payer: 59 | Admitting: Anesthesiology

## 2016-06-05 ENCOUNTER — Ambulatory Visit (HOSPITAL_BASED_OUTPATIENT_CLINIC_OR_DEPARTMENT_OTHER)
Admission: RE | Admit: 2016-06-05 | Discharge: 2016-06-05 | Disposition: A | Discharge status: Home / Self Care | Payer: 59 | Source: Ambulatory Visit | Attending: Urology | Admitting: Urology

## 2016-06-05 ENCOUNTER — Encounter (HOSPITAL_BASED_OUTPATIENT_CLINIC_OR_DEPARTMENT_OTHER): Payer: Self-pay | Admitting: *Deleted

## 2016-06-05 ENCOUNTER — Encounter (HOSPITAL_BASED_OUTPATIENT_CLINIC_OR_DEPARTMENT_OTHER)
Admission: RE | Disposition: A | Discharge status: Home / Self Care | Payer: Self-pay | Source: Ambulatory Visit | Attending: Urology

## 2016-06-05 DIAGNOSIS — F17210 Nicotine dependence, cigarettes, uncomplicated: Secondary | ICD-10-CM | POA: Diagnosis not present

## 2016-06-05 DIAGNOSIS — Z955 Presence of coronary angioplasty implant and graft: Secondary | ICD-10-CM | POA: Diagnosis not present

## 2016-06-05 DIAGNOSIS — Z7982 Long term (current) use of aspirin: Secondary | ICD-10-CM | POA: Insufficient documentation

## 2016-06-05 DIAGNOSIS — R3912 Poor urinary stream: Secondary | ICD-10-CM | POA: Diagnosis not present

## 2016-06-05 DIAGNOSIS — N401 Enlarged prostate with lower urinary tract symptoms: Secondary | ICD-10-CM | POA: Insufficient documentation

## 2016-06-05 DIAGNOSIS — N503 Cyst of epididymis: Secondary | ICD-10-CM | POA: Diagnosis present

## 2016-06-05 DIAGNOSIS — Z8249 Family history of ischemic heart disease and other diseases of the circulatory system: Secondary | ICD-10-CM | POA: Insufficient documentation

## 2016-06-05 DIAGNOSIS — I252 Old myocardial infarction: Secondary | ICD-10-CM | POA: Insufficient documentation

## 2016-06-05 DIAGNOSIS — I2510 Atherosclerotic heart disease of native coronary artery without angina pectoris: Secondary | ICD-10-CM | POA: Diagnosis not present

## 2016-06-05 DIAGNOSIS — Z87438 Personal history of other diseases of male genital organs: Secondary | ICD-10-CM | POA: Diagnosis not present

## 2016-06-05 DIAGNOSIS — N529 Male erectile dysfunction, unspecified: Secondary | ICD-10-CM | POA: Insufficient documentation

## 2016-06-05 DIAGNOSIS — E7800 Pure hypercholesterolemia, unspecified: Secondary | ICD-10-CM | POA: Insufficient documentation

## 2016-06-05 DIAGNOSIS — Z79899 Other long term (current) drug therapy: Secondary | ICD-10-CM | POA: Diagnosis not present

## 2016-06-05 DIAGNOSIS — I10 Essential (primary) hypertension: Secondary | ICD-10-CM | POA: Diagnosis not present

## 2016-06-05 DIAGNOSIS — I771 Stricture of artery: Secondary | ICD-10-CM | POA: Diagnosis not present

## 2016-06-05 HISTORY — DX: Presence of coronary angioplasty implant and graft: Z95.5

## 2016-06-05 HISTORY — PX: EPIDIDYMECTOMY: SHX6275

## 2016-06-05 HISTORY — DX: Personal history of adenomatous and serrated colon polyps: Z86.0101

## 2016-06-05 HISTORY — DX: Other obstructive and reflux uropathy: N40.1

## 2016-06-05 HISTORY — DX: Benign prostatic hyperplasia with lower urinary tract symptoms: N13.8

## 2016-06-05 HISTORY — DX: Male erectile dysfunction, unspecified: N52.9

## 2016-06-05 HISTORY — DX: Personal history of colonic polyps: Z86.010

## 2016-06-05 HISTORY — DX: Left anterior fascicular block: I44.4

## 2016-06-05 HISTORY — DX: Cyst of epididymis: N50.3

## 2016-06-05 HISTORY — DX: Simple chronic bronchitis: J41.0

## 2016-06-05 HISTORY — DX: Presence of spectacles and contact lenses: Z97.3

## 2016-06-05 HISTORY — DX: Poor urinary stream: R39.12

## 2016-06-05 HISTORY — DX: Presence of dental prosthetic device (complete) (partial): Z97.2

## 2016-06-05 HISTORY — DX: Old myocardial infarction: I25.2

## 2016-06-05 HISTORY — DX: Presence of dental prosthetic device (complete) (partial): K08.109

## 2016-06-05 HISTORY — DX: Emphysema, unspecified: J43.9

## 2016-06-05 LAB — POCT I-STAT 4, (NA,K, GLUC, HGB,HCT)
Glucose, Bld: 100 mg/dL — ABNORMAL HIGH (ref 65–99)
HCT: 44 % (ref 39.0–52.0)
Hemoglobin: 15 g/dL (ref 13.0–17.0)
Potassium: 4.2 mmol/L (ref 3.5–5.1)
Sodium: 141 mmol/L (ref 135–145)

## 2016-06-05 SURGERY — EPIDIDYMECTOMY
Anesthesia: General | Site: Scrotum | Laterality: Left

## 2016-06-05 MED ORDER — LIDOCAINE 2% (20 MG/ML) 5 ML SYRINGE
INTRAMUSCULAR | Status: AC
  Filled 2016-06-05: qty 5

## 2016-06-05 MED ORDER — BUPIVACAINE HCL (PF) 0.25 % IJ SOLN
INTRAMUSCULAR | Status: DC | PRN
  Administered 2016-06-05: 27 mL

## 2016-06-05 MED ORDER — CEFAZOLIN SODIUM-DEXTROSE 2-4 GM/100ML-% IV SOLN
2.0000 g | INTRAVENOUS | Status: AC
  Administered 2016-06-05: 2 g via INTRAVENOUS
  Filled 2016-06-05: qty 100

## 2016-06-05 MED ORDER — HYDROMORPHONE HCL 1 MG/ML IJ SOLN
0.2500 mg | INTRAMUSCULAR | Status: DC | PRN
  Filled 2016-06-05: qty 0.5

## 2016-06-05 MED ORDER — ONDANSETRON HCL 4 MG/2ML IJ SOLN
INTRAMUSCULAR | Status: AC
  Filled 2016-06-05: qty 2

## 2016-06-05 MED ORDER — OXYCODONE HCL 5 MG/5ML PO SOLN
5.0000 mg | Freq: Once | ORAL | Status: AC | PRN
  Filled 2016-06-05: qty 5

## 2016-06-05 MED ORDER — FENTANYL CITRATE (PF) 100 MCG/2ML IJ SOLN
INTRAMUSCULAR | Status: AC
  Filled 2016-06-05: qty 2

## 2016-06-05 MED ORDER — OXYCODONE HCL 5 MG PO TABS
ORAL_TABLET | ORAL | Status: AC
  Filled 2016-06-05: qty 1

## 2016-06-05 MED ORDER — DEXAMETHASONE SODIUM PHOSPHATE 10 MG/ML IJ SOLN
INTRAMUSCULAR | Status: AC
  Filled 2016-06-05: qty 1

## 2016-06-05 MED ORDER — ACETAMINOPHEN 500 MG PO TABS
ORAL_TABLET | ORAL | Status: AC
  Filled 2016-06-05: qty 2

## 2016-06-05 MED ORDER — PROMETHAZINE HCL 25 MG/ML IJ SOLN
6.2500 mg | INTRAMUSCULAR | Status: DC | PRN
Start: 2016-06-05 — End: 2016-06-05
  Filled 2016-06-05: qty 1

## 2016-06-05 MED ORDER — CHLORHEXIDINE GLUCONATE 4 % EX LIQD
Freq: Once | CUTANEOUS | Status: DC
  Filled 2016-06-05: qty 15

## 2016-06-05 MED ORDER — LACTATED RINGERS IV SOLN
INTRAVENOUS | Status: DC
  Filled 2016-06-05: qty 1000

## 2016-06-05 MED ORDER — ONDANSETRON HCL 4 MG/2ML IJ SOLN
INTRAMUSCULAR | Status: DC | PRN
  Administered 2016-06-05: 4 mg via INTRAVENOUS

## 2016-06-05 MED ORDER — MEPERIDINE HCL 25 MG/ML IJ SOLN
6.2500 mg | INTRAMUSCULAR | Status: DC | PRN
  Filled 2016-06-05: qty 1

## 2016-06-05 MED ORDER — OXYCODONE-ACETAMINOPHEN 5-325 MG PO TABS: 1.0000 | ORAL_TABLET | ORAL | 0 refills | Status: DC | PRN

## 2016-06-05 MED ORDER — ACETAMINOPHEN 500 MG PO TABS
1000.0000 mg | ORAL_TABLET | Freq: Four times a day (QID) | ORAL | Status: DC | PRN
  Administered 2016-06-05: 1000 mg via ORAL
  Filled 2016-06-05: qty 2

## 2016-06-05 MED ORDER — OXYCODONE HCL 5 MG PO TABS
5.0000 mg | ORAL_TABLET | Freq: Once | ORAL | Status: AC | PRN
  Administered 2016-06-05: 5 mg via ORAL
  Filled 2016-06-05: qty 1

## 2016-06-05 MED ORDER — FENTANYL CITRATE (PF) 100 MCG/2ML IJ SOLN
INTRAMUSCULAR | Status: DC | PRN
  Administered 2016-06-05 (×4): 25 ug via INTRAVENOUS

## 2016-06-05 MED ORDER — MIDAZOLAM HCL 2 MG/2ML IJ SOLN
INTRAMUSCULAR | Status: AC
  Filled 2016-06-05: qty 2

## 2016-06-05 MED ORDER — LIDOCAINE 2% (20 MG/ML) 5 ML SYRINGE
INTRAMUSCULAR | Status: DC | PRN
  Administered 2016-06-05: 60 mg via INTRAVENOUS

## 2016-06-05 MED ORDER — MIDAZOLAM HCL 5 MG/5ML IJ SOLN
INTRAMUSCULAR | Status: DC | PRN
  Administered 2016-06-05: 2 mg via INTRAVENOUS

## 2016-06-05 MED ORDER — DEXAMETHASONE SODIUM PHOSPHATE 4 MG/ML IJ SOLN
INTRAMUSCULAR | Status: DC | PRN
  Administered 2016-06-05: 10 mg via INTRAVENOUS

## 2016-06-05 MED ORDER — PROPOFOL 10 MG/ML IV BOLUS
INTRAVENOUS | Status: AC
Start: 2016-06-05 — End: 2016-06-05
  Filled 2016-06-05: qty 40

## 2016-06-05 MED ORDER — PROPOFOL 10 MG/ML IV BOLUS
INTRAVENOUS | Status: DC | PRN
  Administered 2016-06-05: 140 mg via INTRAVENOUS

## 2016-06-05 MED ORDER — LACTATED RINGERS IV SOLN
INTRAVENOUS | Status: DC
  Administered 2016-06-05: 08:00:00 via INTRAVENOUS
  Filled 2016-06-05: qty 1000

## 2016-06-05 MED ORDER — CEFAZOLIN SODIUM-DEXTROSE 2-4 GM/100ML-% IV SOLN
INTRAVENOUS | Status: AC
  Filled 2016-06-05: qty 100

## 2016-06-05 SURGICAL SUPPLY — 48 items
BLADE CLIPPER SURG (BLADE) ×2 IMPLANT
BLADE SURG 15 STRL LF DISP TIS (BLADE) ×1 IMPLANT
BLADE SURG 15 STRL SS (BLADE) ×1
BNDG GAUZE ELAST 4 BULKY (GAUZE/BANDAGES/DRESSINGS) ×2 IMPLANT
BRIEF STRETCH FOR OB PAD LRG (UNDERPADS AND DIAPERS) ×2 IMPLANT
CLEANER CAUTERY TIP 5X5 PAD (MISCELLANEOUS) ×1 IMPLANT
COVER BACK TABLE 60X90IN (DRAPES) ×2 IMPLANT
COVER MAYO STAND STRL (DRAPES) ×2 IMPLANT
DERMABOND ADVANCED (GAUZE/BANDAGES/DRESSINGS) ×1
DERMABOND ADVANCED .7 DNX12 (GAUZE/BANDAGES/DRESSINGS) ×1 IMPLANT
DRAIN PENROSE 18X1/2 LTX STRL (DRAIN) ×2 IMPLANT
DRAIN PENROSE 18X1/4 LTX STRL (WOUND CARE) IMPLANT
DRAPE LAPAROTOMY 100X72 PEDS (DRAPES) ×2 IMPLANT
ELECT NEEDLE TIP 2.8 STRL (NEEDLE) ×2 IMPLANT
ELECT REM PT RETURN 9FT ADLT (ELECTROSURGICAL) ×2
ELECTRODE REM PT RTRN 9FT ADLT (ELECTROSURGICAL) ×1 IMPLANT
GLOVE BIO SURGEON STRL SZ7.5 (GLOVE) ×2 IMPLANT
GLOVE BIOGEL PI IND STRL 7.0 (GLOVE) ×1 IMPLANT
GLOVE BIOGEL PI IND STRL 7.5 (GLOVE) ×1 IMPLANT
GLOVE BIOGEL PI INDICATOR 7.0 (GLOVE) ×1
GLOVE BIOGEL PI INDICATOR 7.5 (GLOVE) ×1
GLOVE ECLIPSE 7.0 STRL STRAW (GLOVE) ×2 IMPLANT
GOWN STRL REUS W/ TWL LRG LVL3 (GOWN DISPOSABLE) IMPLANT
GOWN STRL REUS W/ TWL XL LVL3 (GOWN DISPOSABLE) ×1 IMPLANT
GOWN STRL REUS W/TWL LRG LVL3 (GOWN DISPOSABLE)
GOWN STRL REUS W/TWL XL LVL3 (GOWN DISPOSABLE) ×3 IMPLANT
KIT ROOM TURNOVER WOR (KITS) ×2 IMPLANT
MANIFOLD NEPTUNE II (INSTRUMENTS) IMPLANT
NDL SAFETY ECLIPSE 18X1.5 (NEEDLE) IMPLANT
NEEDLE HYPO 18GX1.5 SHARP (NEEDLE)
NEEDLE HYPO 22GX1.5 SAFETY (NEEDLE) IMPLANT
NS IRRIG 500ML POUR BTL (IV SOLUTION) ×2 IMPLANT
PACK BASIN DAY SURGERY FS (CUSTOM PROCEDURE TRAY) ×2 IMPLANT
PAD CLEANER CAUTERY TIP 5X5 (MISCELLANEOUS) ×1
PENCIL BUTTON HOLSTER BLD 10FT (ELECTRODE) ×2 IMPLANT
SUT ETHILON 3 0 FSL (SUTURE) ×2 IMPLANT
SUT PROLENE 1 CT 1 30 (SUTURE) IMPLANT
SUT VIC AB 0 CT1 36 (SUTURE) ×2 IMPLANT
SUT VIC AB 3-0 CT1 36 (SUTURE) ×2 IMPLANT
SUT VIC AB 3-0 SH 27 (SUTURE) ×1
SUT VIC AB 3-0 SH 27X BRD (SUTURE) ×1 IMPLANT
SUT VIC AB 3-0 X1 27 (SUTURE) IMPLANT
SUT VICRYL 3 0 BR 18  UND (SUTURE) ×1
SUT VICRYL 3 0 BR 18 UND (SUTURE) ×1 IMPLANT
SYR CONTROL 10ML LL (SYRINGE) IMPLANT
TRAY DSU PREP LF (CUSTOM PROCEDURE TRAY) ×2 IMPLANT
TUBE CONNECTING 12X1/4 (SUCTIONS) IMPLANT
WATER STERILE IRR 500ML POUR (IV SOLUTION) IMPLANT

## 2016-06-05 NOTE — Anesthesia Postprocedure Evaluation (Addendum)
Anesthesia Post Note  Patient: Tyrone Stanton  Procedure(s) Performed: Procedure(s) (LRB): LEFT SCROTAL EXPLORATION WITH MULTIPLE REMOVALS OF EPIDIDYMAL CYSTS (Left)  Patient location during evaluation: PACU Anesthesia Type: General Level of consciousness: awake and alert Pain management: pain level controlled Vital Signs Assessment: post-procedure vital signs reviewed and stable Respiratory status: spontaneous breathing, nonlabored ventilation, respiratory function stable and patient connected to nasal cannula oxygen Cardiovascular status: blood pressure returned to baseline and stable Postop Assessment: no signs of nausea or vomiting Anesthetic complications: no       Last Vitals:  Vitals:   06/05/16 1145 06/05/16 1225  BP: 117/90 139/79  Pulse: 79 73  Resp: 14 14  Temp:  36.5 C    Last Pain:  Vitals:   06/05/16 1230  TempSrc:   PainSc: 5                  Shelton SilvasKevin D Jazmin Ley

## 2016-06-05 NOTE — Anesthesia Procedure Notes (Addendum)
Procedure Name: LMA Insertion Performed by: Briant SitesENENNY, Tyrone Calcaterra T Pre-anesthesia Checklist: Patient identified, Emergency Drugs available, Suction available and Patient being monitored Patient Re-evaluated:Patient Re-evaluated prior to inductionOxygen Delivery Method: Circle system utilized Preoxygenation: Pre-oxygenation with 100% oxygen Intubation Type: IV induction Ventilation: Mask ventilation without difficulty LMA: LMA inserted LMA Size: 5.0 Number of attempts: 1 Airway Equipment and Method: Bite block Placement Confirmation: positive ETCO2 Tube secured with: Tape Dental Injury: Teeth and Oropharynx as per pre-operative assessment  Comments: Rm air sat 93%  Pre oxygenated to 100%

## 2016-06-05 NOTE — Anesthesia Preprocedure Evaluation (Signed)
Anesthesia Evaluation  Patient identified by MRN, date of birth, ID band Patient awake    Reviewed: Allergy & Precautions, NPO status , Patient's Chart, lab work & pertinent test results  Airway Mallampati: I       Dental  (+) Upper Dentures, Lower Dentures   Pulmonary COPD, Current Smoker,    breath sounds clear to auscultation       Cardiovascular hypertension, + CAD, + Past MI and + Cardiac Stents  + dysrhythmias  Rhythm:Regular Rate:Normal     Neuro/Psych negative neurological ROS  negative psych ROS   GI/Hepatic negative GI ROS, Neg liver ROS,   Endo/Other  negative endocrine ROS  Renal/GU negative Renal ROS  negative genitourinary   Musculoskeletal negative musculoskeletal ROS (+)   Abdominal   Peds negative pediatric ROS (+)  Hematology negative hematology ROS (+)   Anesthesia Other Findings - HLD  Reproductive/Obstetrics negative OB ROS                             Lab Results  Component Value Date   WBC 7.7 01/23/2015   HGB 15.0 06/05/2016   HCT 44.0 06/05/2016   MCV 89.3 01/23/2015   PLT 281 01/23/2015   Lab Results  Component Value Date   CREATININE 0.76 01/23/2015   BUN 14 01/23/2015   NA 141 06/05/2016   K 4.2 06/05/2016   CL 104 01/23/2015   CO2 26 01/23/2015   No results found for: INR, PROTIME  11/2015 EKG: normal sinus rhythm.  Echo: - Left ventricle: The cavity size was normal. Systolic function was   mildly reduced. The estimated ejection fraction was approximately   45%. Features are consistent with a pseudonormal left ventricular   filling pattern, with concomitant abnormal relaxation and   increased filling pressure (grade 2 diastolic dysfunction). Very   mild concentric left ventricular hypertrophy. - Regional wall motion abnormality: Akinesis of the mid anterior,   mid anteroseptal, and apical septal myocardium; severe   hypokinesis of the apical  inferior myocardium; moderate   hypokinesis of the apical anterior myocardium; mild hypokinesis   of the mid anterolateral, apical lateral, and apical myocardium. - Aortic valve: Mildly to moderately calcified annulus. Mildly   thickened leaflets. - Mitral valve: Mildly calcified annulus. Normal thickness leaflets   . There was mild regurgitation. - Right ventricle: Systolic function was mildly reduced. - Tricuspid valve: There was mild regurgitation.   Anesthesia Physical Anesthesia Plan  ASA: III  Anesthesia Plan: General   Post-op Pain Management:    Induction: Intravenous  Airway Management Planned: LMA  Additional Equipment:   Intra-op Plan:   Post-operative Plan: Extubation in OR  Informed Consent: I have reviewed the patients History and Physical, chart, labs and discussed the procedure including the risks, benefits and alternatives for the proposed anesthesia with the patient or authorized representative who has indicated his/her understanding and acceptance.   Dental advisory given  Plan Discussed with: CRNA  Anesthesia Plan Comments:         Anesthesia Quick Evaluation

## 2016-06-05 NOTE — Discharge Instructions (Signed)
°  Post Anesthesia Home Care Instructions  Activity: Get plenty of rest for the remainder of the day. A responsible adult should stay with you for 24 hours following the procedure.  For the next 24 hours, DO NOT: -Drive a car -Advertising copywriterperate machinery -Drink alcoholic beverages -Take any medication unless instructed by your physician -Make any legal decisions or sign important papers.  Meals: Start with liquid foods such as gelatin or soup. Progress to regular foods as tolerated. Avoid greasy, spicy, heavy foods. If nausea and/or vomiting occur, drink only clear liquids until the nausea and/or vomiting subsides. Call your physician if vomiting continues.  Special Instructions/Symptoms: Your throat may feel dry or sore from the anesthesia or the breathing tube placed in your throat during surgery. If this causes discomfort, gargle with warm salt water. The discomfort should disappear within 24 hours.  If you had a scopolamine patch placed behind your ear for the management of post- operative nausea and/or vomiting:  1. The medication in the patch is effective for 72 hours, after which it should be removed.  Wrap patch in a tissue and discard in the trash. Wash hands thoroughly with soap and water. 2. You may remove the patch earlier than 72 hours if you experience unpleasant side effects which may include dry mouth, dizziness or visual disturbances. 3. Avoid touching the patch. Wash your hands with soap and water after contact with the patch.    HOME CARE INSTRUCTIONS FOR SCROTAL PROCEDURES  Wound Care & Hygiene: You may apply an ice bag to the scrotum for the first 24 hours.  This may help decrease swelling and soreness.  You may have a dressing held in place by an athletic supporter.    Continue to use the athletic supporter or tight briefs for at least a week.  Activity: Rest today - not necessarily flat bed rest.  Just take it easy.  You should not do strenuous activities until your follow-up  visit with your doctor.  You may resume light activity in 48 hours.  Return to Work:  Your doctor will advise you of this depending on the type of work you do  General Expectations: You may have a small amount of bleeding.  The scrotum may be swollen or bruised for about a week.  Call your Doctor if these occur:  -persistent or heavy bleeding  -temperature of 101 degrees or more  -severe pain, not relieved by your pain medication

## 2016-06-05 NOTE — Transfer of Care (Signed)
Immediate Anesthesia Transfer of Care Note  Patient: Tyrone Stanton  Procedure(s) Performed: Procedure(s): LEFT SCROTAL EXPLORATION WITH MULTIPLE REMOVALS OF EPIDIDYMAL CYSTS (Left)  Patient Location: PACU  Anesthesia Type:General  Level of Consciousness: awake, alert  and oriented  Airway & Oxygen Therapy: Patient Spontanous Breathing and Patient connected to nasal cannula oxygen  Post-op Assessment: Report given to RN  Post vital signs: Reviewed and stable  Last Vitals:155/100, 85, 16, 99%, 97.5  Vitals:   06/05/16 0724  BP: 119/76  Pulse: 69  Resp: 16  Temp: 36.4 C    Last Pain:  Vitals:   06/05/16 0724  TempSrc: Oral      Patients Stated Pain Goal: 7 (06/05/16 0740)  Complications: No apparent anesthesia complications

## 2016-06-05 NOTE — Op Note (Signed)
Operative Note:  Preoperative Diagnosis: Left epididymal cysts causing pain  Postoperative Diagnosis:  Same  Procedure(s) Performed:   1. Left scrotal exploration 2. Removal of multiple epididymal cysts (~10 cm and ~4cm)  Teaching Surgeon:  Calton Golds, MD  Resident Surgeon:  Lincoln Brigham, MD  Assistant(s):  None  Anesthesia:  General via LMA  Fluids:  See anesthesia record  Estimated blood loss:  10 mL  Specimens:  Left epididymal cysts sent for final pathology  Cultures:  None  Drains:  None  Complications:  None  Indications: 61 yo male with large left epididymal cysts, which are causing difficulty in his daily activities. The cysts had of the epididymis measured 5.87 x 4.18 x 5.92 cm, and 3.41 x 2.33 x 2.64 cm; and the cysts in his epididymal tail measured reporting 3 x 2.5 x 3.7 cm, and 2.2 x 1.1 x 0.88 cm.  Findings:   Two large loculated epididymal cysts measuring ~10cm and ~4cm. Blood supply to left testicle identified and spared. Otherwise normal appearing left testicle.  Description:  The patient was correctly identified in the preop holding area where written informed consent as well potential risk and complication reviewed. He agreed.  The patient was brought to the operating room and placed supine on the operating room table.  General anesthesia was administered. A time out was performed.  The patient was identified, the consent was reviewed and the side and site of surgery was verified.  The patient was examined and the diagnoses confirmed.   The lower abdomen and genitalia were prepped and draped in the usual sterile manner.    A 5 cm horizontal incision was made along the left hemiscortum with a 15 blade scalpel.  Electrocautery was used to dissect through the subcutaneous tissues, down to the left tunica vaginalis. This was opened and the cysts were immediately identified. The left testicle was delivered intact with the cysts. The left testicular blood  supply was identified and dissected away from the cysts. The rinds were peeled off the cysts until they were isolated down to their connection to the epididymis. The testicle was normal in appearance and on palpation with no masses identified. We then placed a hemostat across the base of each and sharply divided the cysts and sent this for final pathology. The base of each was tied off with a 3-0 vicryl suture. An entry into the epididymis was closed with a running 3-0 vicryl suture.   The left cord was examined to ensure that all cord structures, including the vas were present.  A left spermatic cord block was performed with bupivacaine plain. We ensure adequate hemostasis with Bovie electrocautery. A 1/2 inch penrose drain was inserted in the left hemiscrotum and brought out the dependent portion of the left hemiscrotum. We then placed the left testicle back into the scrotum in normal anatomic position assuring no twisting of the spermatic cord. We again used electrocautery to assure excellent hemostasis.   We then utilized 3-0 vicryl in a running baseball fashion to close dartos and the subcutaneous tissue. Finally, we used 4-0 monocryl running to close the scrotal skin.   The surgical area was cleaned and dried.  Dermabond was applied to the scrotal incision and once dried we placed fluffs and mesh underwear. The patient tolerated the procedure well, was awaken and transferred to PACU in stable condition.  There were no complications.  Final counts were correct.    Post Op Plan:   1. Discharge patient home when meets PACU  criteria.  Attestation:  Dr. Patsi Searsannenbaum was present and scrubbed for the entirety of the procedure.

## 2016-06-05 NOTE — Interval H&P Note (Signed)
History and Physical Interval Note:  06/05/2016 9:05 AM  Tyrone Stanton  has presented today for surgery, with the diagnosis of LEFT EPIDIDYMAL CYST  The various methods of treatment have been discussed with the patient and family. After consideration of risks, benefits and other options for treatment, the patient has consented to  Procedure(s): LEFT EPIDIDYMECTOMY, POSSIBLE REMOVAL OF LEFT TESTICAL (Left) as a surgical intervention .  The patient's history has been reviewed, patient examined, no change in status, stable for surgery.  I have reviewed the patient's chart and labs.  Questions were answered to the patient's satisfaction.     Rolando Whitby I Tylia Ewell Pt seen and operation again discussed possibility of orchiectomy or delayed testis atrophy. We will attempt to identify and save testis vessels. Possible drain. Ice for weekend and sit in lounge chair *( semi-Fowler's position).

## 2016-06-08 ENCOUNTER — Encounter (HOSPITAL_BASED_OUTPATIENT_CLINIC_OR_DEPARTMENT_OTHER): Payer: Self-pay | Admitting: Urology

## 2016-07-06 ENCOUNTER — Other Ambulatory Visit: Payer: Self-pay | Admitting: Internal Medicine

## 2016-10-02 NOTE — Addendum Note (Signed)
Addendum  created 10/02/16 0959 by Shelton SilvasHollis, Kevin D, MD   Sign clinical note

## 2016-10-22 ENCOUNTER — Other Ambulatory Visit: Payer: Self-pay

## 2016-10-22 MED ORDER — LISINOPRIL 20 MG PO TABS: 20.0000 mg | ORAL_TABLET | Freq: Every day | ORAL | 0 refills | Status: DC

## 2016-11-07 ENCOUNTER — Other Ambulatory Visit: Payer: Self-pay | Admitting: Internal Medicine

## 2016-11-08 ENCOUNTER — Other Ambulatory Visit: Payer: Self-pay | Admitting: Internal Medicine

## 2016-11-26 ENCOUNTER — Ambulatory Visit (INDEPENDENT_AMBULATORY_CARE_PROVIDER_SITE_OTHER): Payer: 59 | Admitting: Internal Medicine

## 2016-11-26 VITALS — BP 140/82 | HR 70 | Ht 72.0 in | Wt 183.2 lb

## 2016-11-26 DIAGNOSIS — I2510 Atherosclerotic heart disease of native coronary artery without angina pectoris: Secondary | ICD-10-CM | POA: Diagnosis not present

## 2016-11-26 DIAGNOSIS — I255 Ischemic cardiomyopathy: Secondary | ICD-10-CM | POA: Diagnosis not present

## 2016-11-26 DIAGNOSIS — F17200 Nicotine dependence, unspecified, uncomplicated: Secondary | ICD-10-CM | POA: Diagnosis not present

## 2016-11-26 DIAGNOSIS — I2583 Coronary atherosclerosis due to lipid rich plaque: Secondary | ICD-10-CM

## 2016-11-26 DIAGNOSIS — E785 Hyperlipidemia, unspecified: Secondary | ICD-10-CM | POA: Diagnosis not present

## 2016-11-26 DIAGNOSIS — I779 Disorder of arteries and arterioles, unspecified: Secondary | ICD-10-CM | POA: Diagnosis not present

## 2016-11-26 DIAGNOSIS — Z79899 Other long term (current) drug therapy: Secondary | ICD-10-CM | POA: Diagnosis not present

## 2016-11-26 DIAGNOSIS — I739 Peripheral vascular disease, unspecified: Secondary | ICD-10-CM

## 2016-11-26 LAB — LIPID PANEL
Chol/HDL Ratio: 4.2 ratio (ref 0.0–5.0)
Cholesterol, Total: 129 mg/dL (ref 100–199)
HDL: 31 mg/dL — ABNORMAL LOW (ref >39)
LDL Calculated: 73 mg/dL (ref 0–99)
Triglycerides: 126 mg/dL (ref 0–149)
VLDL Cholesterol Cal: 25 mg/dL (ref 5–40)

## 2016-11-26 LAB — COMPREHENSIVE METABOLIC PANEL
ALT: 15 IU/L (ref 0–44)
AST: 19 IU/L (ref 0–40)
Albumin/Globulin Ratio: 1.6 (ref 1.2–2.2)
Albumin: 4.4 g/dL (ref 3.6–4.8)
Alkaline Phosphatase: 73 IU/L (ref 39–117)
BUN/Creatinine Ratio: 13 (ref 10–24)
BUN: 11 mg/dL (ref 8–27)
Bilirubin Total: 0.4 mg/dL (ref 0.0–1.2)
CO2: 22 mmol/L (ref 20–29)
Calcium: 9.6 mg/dL (ref 8.6–10.2)
Chloride: 101 mmol/L (ref 96–106)
Creatinine, Ser: 0.82 mg/dL (ref 0.76–1.27)
GFR calc Af Amer: 110 mL/min/{1.73_m2} (ref >59)
GFR calc non Af Amer: 95 mL/min/{1.73_m2} (ref >59)
Globulin, Total: 2.8 g/dL (ref 1.5–4.5)
Glucose: 95 mg/dL (ref 65–99)
Potassium: 4.5 mmol/L (ref 3.5–5.2)
Sodium: 138 mmol/L (ref 134–144)
Total Protein: 7.2 g/dL (ref 6.0–8.5)

## 2016-11-26 MED ORDER — SILDENAFIL CITRATE 20 MG PO TABS: ORAL_TABLET | ORAL | 0 refills | Status: DC

## 2016-11-26 MED ORDER — CLOPIDOGREL BISULFATE 75 MG PO TABS: 75.0000 mg | ORAL_TABLET | Freq: Every day | ORAL | 3 refills | Status: DC

## 2016-11-26 MED ORDER — LISINOPRIL 20 MG PO TABS: 20.0000 mg | ORAL_TABLET | Freq: Every day | ORAL | 3 refills | Status: DC

## 2016-11-26 MED ORDER — CARVEDILOL 12.5 MG PO TABS: 12.5000 mg | ORAL_TABLET | Freq: Two times a day (BID) | ORAL | 3 refills | Status: DC

## 2016-11-26 MED ORDER — SIMVASTATIN 80 MG PO TABS: 80.0000 mg | ORAL_TABLET | Freq: Every day | ORAL | 3 refills | Status: DC

## 2016-11-26 NOTE — Patient Instructions (Signed)
Your physician recommends that you return for lab work TODAY - fasting to check metabolic panel & cholesterol   Your physician wants you to follow-up in: ONE YEAR with Dr. Rennis GoldenHilty. You will receive a reminder letter in the mail two months in advance. If you don't receive a letter, please call our office to schedule the follow-up appointment.

## 2016-11-26 NOTE — Progress Notes (Signed)
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 He has a history of a large anteroapical infarct in the past with a low EF of less than 40%, actually 39% at his last stress test and New [place] Heart Association Class I-II symptoms. He continues to be generally asymptomatic and can do a high level of exercise. He is here today again for DOT physical and underwent exercise nuclear stress testing on December 12, 2010, with 8 minutes of exercise and 10 metabolic equivalents. The stress test demonstrated a moderate to severe infarct in the apical anterior, apical basal, inferoseptal, inferior, mid-inferoseptal, mid-inferior, and apical inferior regions, which is thought to be unchanged from his previous study. He describes no new cardiovascular symptoms. He is requesting another stress test per DOT guidelines.   Tyrone Stanton returns today for his annual visit. At his last office visit I recommended another stress test however it was declined by his insurance company. Probably because he is asymptomatic however it was requested per DOT guidelines which are not up-to-date. He continues to feel very well and does high level of work and exercise. He denies any chest pain or shortness of breath. We have not reassess his LV function and over 3 years and is due for repeat echocardiography. He is also not had a lipid assessment and over a year.  Tyrone Stanton returns today for follow-up. At his last office visit he had reassessment of LV function by echo which showed EF stable at 40%. Fortunately continues to smoke. 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 He continues to have problems with erectile dysfunction but seems to be well-controlled with Viagra. He is not yet  established a primary care provider. He is previously seen a urologist Dr. Brunilda PayorNesi, but has not followed up.  11/22/2015  Tyrone Stanton returns today for follow-up. Over the past year he's had no new complaints. He continues to work long days and has no problems with chest pain or worsening shortness of breath. He is in need of a refill of his Revatio. He is not establish with a primary care provider. I asked him whether he never had screening colonoscopy and he has not yet. I will therefore refer him for that. 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 He's not had repeat imaging since that time. He continues to smoke about 1-1/2 packs per day. I reviewed current screening guidelines which indicate a recommendation for a CT scan of the chest and smokers that are in their 60s. I'm recommending him to get a CT scan to screen for early malignancies.  11/26/2016  Tyrone Stanton was seen today in follow-up. He reports feeling well. He says he's somewhat interested in stopping smoking however has not yet been able to do that. Recently had surgery to remove a cyst from his testicle which was benign. He is followed with [facility]. He denies any worsening chest pain. He does have shortness of breath but it's been stable. He's not had any lab work but recently had a DOT physical. Weight is down about 5 pounds and his BMI is normal. He is due for repeat lipid profile. He does not have a primary care provider.  Past Medical History:  Diagnosis Date  . BPH with urinary obstruction   . Carotid bruit    asymptomatic ;Carotid doppler 2014 mild RICA plaque  . COPD with emphysema (HCC)   . Coronary artery disease cardiologist-  dr [place] Ice   hx large anteroapical MI  s/p  PCI with DES x3  to mCFX, pRCA, and OM1--- last Echo-45-50%,LV normal  . Dyslipidemia   . ED (erectile dysfunction)   . Epididymal cyst    left  . Full dentures   . History of  adenomatous polyp of colon    01-28-2016  tubular adenoma's  . History of anteroapical myocardial infarction 12-29-2007    dr berry   pt entirely asymptomatic w/  positive Myoview--- per Cardiac Cath  3 vessel disease w/ mild to mod. LV dyfunction  . Hypertension   . Ischemic cardiomyopathy per echo 04-18-2015 -- ef 45%   12-12-2010  Exercise stress test-EF 37% mod. to severe perfusion defect to infarct \\scar  with mild perinfarct ischemia -apical anterior,apical basal inferioseptal, basal inferior, mid inferoseptal, mid inferior and apical inferior regions. the post stess LV  normal.;New York Heart Assoc. class l and II symptoms  . LAFB (left anterior fascicular block)   . S/P drug eluting coronary stent placement 01/11/2008   x3  to midCFX,  pRCA, and OM1  . Smokers' cough (HCC)    intermittant productive  . Weak urinary stream   . Wears glasses     Past Surgical History:  Procedure Laterality Date  . CARDIAC CATHETERIZATION  12/29/2007   dr berry   LAD-long segmental 50 to 60% after the first mod. diagonal branch btwn ST1 and ST2; Lge OM2 80% proximal; RCA,RV branch 80 to 90% mid; RCA gave off PDA and PLA  appeared  to be 90% at the origin   . CARDIOVASCULAR STRESS TEST  12/12/2010  dr berry   Abnormal Low Risk nuclear study (compared to previous study, no signigicant change) moderate to severe perfusion defect , scar/infarct, w/ mild perinfarct ischemia in the apical anterior, apical, basal inferoseptal , basal inferor, mid inferoseptal, mid inferor and apical inferior regions/ post-stress EF 37% w/ dyskinesis in the apical regions & moderate hypokinesi basal & mid inferior regions  . COLONOSCOPY  01/28/2016  . CORONARY ANGIOPLASTY WITH STENT PLACEMENT  09/092009    dr berry   Cutting balloon atherectomy Mid Circ 2.5x12 Promus; Ostium of the 1st marginal branch angoiplasty result 20-30%;RCA stented 2.5 x 15 Promus post dilated w/ 2.75x12 [place] reduce80-90% proximal dominanat RCAto 0%  (total DES x3)  . EPIDIDYMECTOMY Left 06/05/2016   Procedure: LEFT SCROTAL EXPLORATION WITH MULTIPLE REMOVALS OF EPIDIDYMAL CYSTS;  Surgeon: Jethro BolusSigmund Tannenbaum, MD;  [facility];  Service: Urology;  Laterality: Left;  . LUMBAR LAMINECTOMY  1988  . TRANSTHORACIC ECHOCARDIOGRAM  04/18/2015   very mild LVH,  ef 45%, akinesis of the mid anterior, mid anteroseptal , and apical septal ; severe hypokinesis of the apical inferior'; moderate hypokinesis of the apical anterior;  mild hypokinesis of the mid anterolateral, apical lateral, and apical;  grade 2 diastolic dysfunction/  mild to moderate AV calcification without stenosis/  mild MR and TR/  mild reduced RVSF/      FAMHx:  Family History  Problem Relation Age of Onset  . Stroke Mother   . Cancer Mother   . Heart disease Father     SOCHx:   reports that he has been smoking Cigarettes.  He has a 94.00 pack-year  smoking history. He has never used smokeless tobacco. He reports that he does not drink alcohol or use drugs.  ALLERGIES:  No Known Allergies  ROS: Pertinent items noted in HPI and remainder of comprehensive ROS otherwise negative.  HOME MEDS: Current Outpatient Prescriptions  Medication Sig Dispense Refill  . carvedilol (COREG) 12.5 MG tablet TAKE 1 TABLET (12.5 MG TOTAL) BY MOUTH 2 (TWO) TIMES DAILY. 60 tablet 0  . clopidogrel (PLAVIX) 75 MG tablet TAKE 1 TABLET BY MOUTH EVERY DAY 90 tablet 2  . lisinopril (PRINIVIL,ZESTRIL) 20 MG tablet Take 1 tablet (20 mg total) by mouth daily. PLEASE MAKE APPOINTMENT FOR FURTHER REFILLS 90 tablet 0  . sildenafil (REVATIO) 20 MG tablet TAKE 2 TO 3 TABLETS (40-60 MG TOTAL) BY MOUTH DAILY AS NEEDED. 50 tablet 1  . simvastatin (ZOCOR) 80 MG tablet Take 1 tablet (80 mg total) by mouth at bedtime. 90 tablet 3   No current facility-administered medications for this visit.    Facility-Administered Medications Ordered in Other Visits  Medication Dose Route Frequency  Provider Last Rate Last Dose  . acetaminophen (TYLENOL) tablet 975 mg  975 mg Oral Q6H PRN Jethro Bolus, MD        LABS/IMAGING: No results found for this or any previous visit (from the past 48 hour(s)). No results found.  VITALS: BP 140/82   Pulse 70   Ht 6' (1.829 m)   Wt 183 lb 3.2 oz (83.1 kg)   BMI 24.85 kg/m   EXAM: General appearance: alert and no distress Neck: no adenopathy, supple, symmetrical, trachea midline and right carotid bruit Lungs: diminished breath sounds bilaterally Heart: regular rate and rhythm, S1, S2 normal, no murmur, click, rub or gallop Abdomen: soft, non-tender; bowel sounds normal; no masses,  no organomegaly Extremities: extremities normal, atraumatic, no cyanosis or edema Pulses: 2+ and symmetric Skin: Skin color, texture, turgor normal. No rashes or lesions Neurologic: Grossly normal Psych: Pleasant  EKG: Sinus rhythm at 70 with the fusion beat, anterior and inferior infarct age undetermined-personally reviewed  ASSESSMENT: 1. Ischemic cardiomyopathy, EF 40%, with inferior and apical scar, NYHA Class I symptoms 2. Hypertension 3. Hyperlipidemia 4. Continued tobacco abuse 5. ED 6. BPH 7. Probable COPD 8. Mild carotid artery disease  PLAN: 1.   Tyrone Stanton has been asymptomatic. Unfortunately is not interested in stopping smoking at this point. He's had hypertension which is reasonably well controlled at home. Recently has had weight loss. He continues to smoke. We'll need to check a repeat lipid profile. He does have some BPH and EDD is followed by urology. Had a recent testicular cyst which was removed and was benign. He has a faint right carotid bruit but is had only mild carotid artery disease in the past. We'll need to consider repeat carotid Dopplers and an echocardiogram next year.   Follow up annually with me.  Chrystie Nose, MD, [facility] Attending Cardiologist [facility]  Chrystie Nose 11/26/2016, 8:17 AM

## 2016-11-26 NOTE — Addendum Note (Signed)
Addended by: Lindell SparELKINS, Natsumi Whitsitt M on: 11/26/2016 08:24 AM   Modules accepted: Orders

## 2016-11-27 ENCOUNTER — Encounter: Payer: Self-pay | Admitting: Internal Medicine

## 2017-02-26 ENCOUNTER — Other Ambulatory Visit: Payer: Self-pay | Admitting: Internal Medicine

## 2017-03-12 ENCOUNTER — Telehealth (HOSPITAL_COMMUNITY): Payer: Self-pay | Admitting: General Practice

## 2017-04-13 ENCOUNTER — Other Ambulatory Visit: Payer: Self-pay | Admitting: Internal Medicine

## 2017-06-09 ENCOUNTER — Other Ambulatory Visit: Payer: Self-pay | Admitting: Internal Medicine

## 2017-06-10 NOTE — Telephone Encounter (Signed)
Do you want to refill pt rx?

## 2017-08-20 ENCOUNTER — Other Ambulatory Visit: Payer: Self-pay | Admitting: Internal Medicine

## 2017-09-22 ENCOUNTER — Telehealth: Payer: Self-pay | Admitting: Internal Medicine

## 2017-09-22 MED ORDER — SILDENAFIL CITRATE 20 MG PO TABS: 40.0000 mg | ORAL_TABLET | ORAL | 0 refills | Status: DC | PRN

## 2017-09-22 NOTE — Telephone Encounter (Signed)
New message    *STAT* If patient is at the pharmacy, call can be transferred to refill team.   1. Which medications need to be refilled? (please list name of each medication and dose if known) sildenafil (REVATIO) 20 MG tablet  2. Which pharmacy/location (including street and city if local pharmacy) is medication to be sent to? [facility] - [place], [place] - [address]  3. Do they need a 30 day or 90 day supply? 30

## 2017-09-22 NOTE — Telephone Encounter (Signed)
Rx request sent to pharmacy.  Thanks!

## 2017-09-22 NOTE — Telephone Encounter (Signed)
Continue filling?

## 2017-09-22 NOTE — Telephone Encounter (Signed)
Yes .. Ok to refill.  Dr. Rexene Edison

## 2017-11-13 ENCOUNTER — Other Ambulatory Visit: Payer: Self-pay | Admitting: Internal Medicine

## 2017-12-06 ENCOUNTER — Other Ambulatory Visit: Payer: Self-pay | Admitting: Internal Medicine

## 2017-12-10 ENCOUNTER — Other Ambulatory Visit: Payer: Self-pay | Admitting: Internal Medicine

## 2017-12-27 ENCOUNTER — Encounter: Payer: Self-pay | Admitting: Internal Medicine

## 2017-12-27 ENCOUNTER — Ambulatory Visit: Payer: 59 | Admitting: Internal Medicine

## 2017-12-27 VITALS — BP 108/77 | HR 56 | Ht 72.0 in | Wt 179.6 lb

## 2017-12-27 DIAGNOSIS — E785 Hyperlipidemia, unspecified: Secondary | ICD-10-CM | POA: Diagnosis not present

## 2017-12-27 DIAGNOSIS — I255 Ischemic cardiomyopathy: Secondary | ICD-10-CM | POA: Diagnosis not present

## 2017-12-27 DIAGNOSIS — I779 Disorder of arteries and arterioles, unspecified: Secondary | ICD-10-CM

## 2017-12-27 DIAGNOSIS — I2583 Coronary atherosclerosis due to lipid rich plaque: Secondary | ICD-10-CM

## 2017-12-27 DIAGNOSIS — I739 Peripheral vascular disease, unspecified: Secondary | ICD-10-CM

## 2017-12-27 DIAGNOSIS — I2510 Atherosclerotic heart disease of native coronary artery without angina pectoris: Secondary | ICD-10-CM

## 2017-12-27 LAB — COMPREHENSIVE METABOLIC PANEL
ALT: 12 IU/L (ref 0–44)
AST: 17 IU/L (ref 0–40)
Albumin/Globulin Ratio: 1.6 (ref 1.2–2.2)
Albumin: 4.3 g/dL (ref 3.6–4.8)
Alkaline Phosphatase: 78 IU/L (ref 39–117)
BUN/Creatinine Ratio: 12 (ref 10–24)
BUN: 12 mg/dL (ref 8–27)
Bilirubin Total: 0.4 mg/dL (ref 0.0–1.2)
CO2: 25 mmol/L (ref 20–29)
Calcium: 9.8 mg/dL (ref 8.6–10.2)
Chloride: 102 mmol/L (ref 96–106)
Creatinine, Ser: 0.98 mg/dL (ref 0.76–1.27)
GFR calc Af Amer: 94 mL/min/{1.73_m2} (ref >59)
GFR calc non Af Amer: 82 mL/min/{1.73_m2} (ref >59)
Globulin, Total: 2.7 g/dL (ref 1.5–4.5)
Glucose: 100 mg/dL — ABNORMAL HIGH (ref 65–99)
Potassium: 4.9 mmol/L (ref 3.5–5.2)
Sodium: 141 mmol/L (ref 134–144)
Total Protein: 7 g/dL (ref 6.0–8.5)

## 2017-12-27 LAB — LIPID PANEL
Chol/HDL Ratio: 4.7 ratio (ref 0.0–5.0)
Cholesterol, Total: 156 mg/dL (ref 100–199)
HDL: 33 mg/dL — ABNORMAL LOW (ref >39)
LDL Calculated: 101 mg/dL — ABNORMAL HIGH (ref 0–99)
Triglycerides: 109 mg/dL (ref 0–149)
VLDL Cholesterol Cal: 22 mg/dL (ref 5–40)

## 2017-12-27 NOTE — Progress Notes (Signed)
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 He has a history of a large anteroapical infarct in the past with a low EF of less than 40%, actually 39% at his last stress test and New York Heart Association Class I-II symptoms. He continues to be generally asymptomatic and can do a high level of exercise. He is here today again for DOT physical and underwent exercise nuclear stress testing on December 12, 2010, with 8 minutes of exercise and 10 metabolic equivalents. The stress test demonstrated a moderate to severe infarct in the apical anterior, apical basal, inferoseptal, inferior, mid-inferoseptal, mid-inferior, and apical inferior regions, which is thought to be unchanged from his previous study. He describes no new cardiovascular symptoms. He is requesting another stress test per DOT guidelines.   Tyrone Stanton returns today for his annual visit. At his last office visit I recommended another stress test however it was declined by his insurance company. Probably because he is asymptomatic however it was requested per DOT guidelines which are not up-to-date. He continues to feel very well and does high level of work and exercise. He denies any chest pain or shortness of breath. We have not reassess his LV function and over 3 years and is due for repeat echocardiography. He is also not had a lipid assessment and over a year.  Tyrone Stanton returns today for follow-up. At his last office visit he had reassessment of LV function by echo which showed EF stable at 40%. Fortunately continues to smoke. 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 He continues to have problems with erectile dysfunction but seems to be well-controlled with Viagra. He is not yet  established a primary care provider. He is previously seen a urologist [name], but has not followed up.  11/22/2015  Tyrone Stanton returns today for follow-up. Over the past year he's had no new complaints. He continues to work long days and has no problems with chest pain or worsening shortness of breath. He is in need of a refill of his Revatio. He is not establish with a primary care provider. I asked him whether he never had screening colonoscopy and he has not yet. I will therefore refer him for that. 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 He's not had repeat imaging since that time. He continues to smoke about 1-1/2 packs per day. I reviewed current screening guidelines which indicate a recommendation for a CT scan of the chest and smokers that are in their 60s. I'm recommending him to get a CT scan to screen for early malignancies.  11/26/2016  Tyrone Stanton was seen today in follow-up. He reports feeling well. He says he's somewhat interested in stopping smoking however has not yet been able to do that. Recently had surgery to remove a cyst from his testicle which was benign. He is followed with [facility]. He denies any worsening chest pain. He does have shortness of breath but it's been stable. He's not had any lab work but recently had a DOT physical. Weight is down about 5 pounds and his BMI is normal. He is due for repeat lipid profile. He does not have a primary care provider.  12/27/2017  Tyrone Stanton was seen today in routine follow-up.  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  Because of this he says is not been able to stop smoking.  He is also not been able to cut back.  He denies any new chest pain or worsening shortness of breath.  We have discussed multiple times the fact that we need to continue to follow-up carotid Dopplers.  He has a right carotid bruit which showed  mild plaque in 2014 but has not been reassessed.  In addition his last echo was in 2016 which showed LVEF about 40% with anterior MI.  He denies any new weight gain, orthopnea, PND or other heart failure symptoms.  He reports compliance with his medications.  He has not had recent lab work that we could obtain.  Past Medical History:  Diagnosis Date  . BPH with urinary obstruction   . Carotid bruit    asymptomatic ;Carotid doppler 2014 mild RICA plaque  . COPD with emphysema (HCC)   . Coronary artery disease cardiologist-  dr Reonna Finlayson   hx large anteroapical MI  s/p  PCI with DES x3  to mCFX, pRCA, and OM1--- last Echo-45-50%,LV normal  . Dyslipidemia   . ED (erectile dysfunction)   . Epididymal cyst    left  . Full dentures   . History of adenomatous polyp of colon    01-28-2016  tubular adenoma's  . History of anteroapical myocardial infarction 12-29-2007    dr berry   pt entirely asymptomatic w/  positive Myoview--- per Cardiac Cath  3 vessel disease w/ mild to mod. LV dyfunction  . Hypertension   . Ischemic cardiomyopathy per echo 04-18-2015 -- ef 45%   12-12-2010  Exercise stress test-EF 37% mod. to severe perfusion defect to infarct \\scar  with mild perinfarct ischemia -apical anterior,apical basal inferioseptal, basal inferior, mid inferoseptal, mid inferior and apical inferior regions. the post stess LV  normal.;New York Heart Assoc. class l and II symptoms  . LAFB (left anterior fascicular block)   . S/P drug eluting coronary stent placement 01/11/2008   x3  to midCFX,  pRCA, and OM1  . Smokers' cough (HCC)    intermittant productive  . Weak urinary stream   . Wears glasses     Past Surgical History:  Procedure Laterality Date  . CARDIAC CATHETERIZATION  12/29/2007   dr berry   LAD-long segmental 50 to 60% after the first mod. diagonal branch btwn ST1 and ST2; Lge OM2 80% proximal; RCA,RV branch 80 to 90% mid; RCA gave off PDA and PLA  appeared  to be 90% at the origin   .  CARDIOVASCULAR STRESS TEST  12/12/2010  dr berry   Abnormal Low Risk nuclear study (compared to previous study, no signigicant change) moderate to severe perfusion defect , scar/infarct, w/ mild perinfarct ischemia in the apical anterior, apical, basal inferoseptal , basal inferor, mid inferoseptal, mid inferor and apical inferior regions/ post-stress EF 37% w/ dyskinesis in the apical regions & moderate hypokinesi basal & mid inferior regions  . COLONOSCOPY  01/28/2016  . CORONARY ANGIOPLASTY WITH STENT PLACEMENT  09/092009    dr berry   Cutting balloon atherectomy Mid Circ 2.5x12 Promus; Ostium of the 1st marginal branch angoiplasty result 20-30%;RCA stented 2.5 x 15 Promus post dilated w/ 2.75x12  Sprinter reduce80-90% proximal dominanat RCAto 0% (total DES x3)  . EPIDIDYMECTOMY Left 06/05/2016   Procedure: LEFT SCROTAL EXPLORATION WITH MULTIPLE REMOVALS  OF EPIDIDYMAL CYSTS;  Surgeon: Jethro BolusSigmund Tannenbaum, MD;  [facility];  Service: Urology;  Laterality: Left;  . LUMBAR LAMINECTOMY  1988  . TRANSTHORACIC ECHOCARDIOGRAM  04/18/2015   very mild LVH,  ef 45%, akinesis of the mid anterior, mid anteroseptal , and apical septal ; severe hypokinesis of the apical inferior'; moderate hypokinesis of the apical anterior;  mild hypokinesis of the mid anterolateral, apical lateral, and apical;  grade 2 diastolic dysfunction/  mild to moderate AV calcification without stenosis/  mild MR and TR/  mild reduced RVSF/      FAMHx:  Family History  Problem Relation Age of Onset  . Stroke Mother   . Cancer Mother   . Heart disease Father     SOCHx:   reports that he has been smoking cigarettes. He has a 94.00 pack-year smoking history. He has never used smokeless tobacco. He reports that he does not drink alcohol or use drugs.  ALLERGIES:  No Known Allergies  ROS: Pertinent items noted in HPI and remainder of comprehensive ROS otherwise negative.  HOME MEDS: Current Outpatient  Medications  Medication Sig Dispense Refill  . aspirin 81 MG tablet Take 81 mg by mouth daily.    . carvedilol (COREG) 12.5 MG tablet Take 1 tablet (12.5 mg total) by mouth 2 (two) times daily. KEEP OV. 180 tablet 0  . clopidogrel (PLAVIX) 75 MG tablet Take 1 tablet (75 mg total) by mouth daily. 90 tablet 3  . lisinopril (PRINIVIL,ZESTRIL) 20 MG tablet TAKE 1 TABLET BY MOUTH EVERY DAY 90 tablet 3  . sildenafil (REVATIO) 20 MG tablet TAKE 2 TO 3 TABLETS (40-60 MG TOTAL) BY MOUTH AS NEEDED FOR SEXUAL ACTIVITY. 50 tablet 0  . simvastatin (ZOCOR) 80 MG tablet Take 1 tablet (80 mg total) by mouth at bedtime. 90 tablet 3   No current facility-administered medications for this visit.    Facility-Administered Medications Ordered in Other Visits  Medication Dose Route Frequency Provider Last Rate Last Dose  . acetaminophen (TYLENOL) tablet 975 mg  975 mg Oral Q6H PRN Jethro Bolusannenbaum, Sigmund, MD        LABS/IMAGING: No results found for this or any previous visit (from the past 48 hour(s)). No results found.  VITALS: BP 108/77   Pulse (!) 56   Ht 6' (1.829 m)   Wt 179 lb 9.6 oz (81.5 kg)   BMI 24.36 kg/m   EXAM: General appearance: alert and no distress Neck: no adenopathy, supple, symmetrical, trachea midline and right carotid bruit Lungs: diminished breath sounds bilaterally Heart: regular rate and rhythm, S1, S2 normal, no murmur, click, rub or gallop Abdomen: soft, non-tender; bowel sounds normal; no masses,  no organomegaly Extremities: extremities normal, atraumatic, no cyanosis or edema Pulses: 2+ and symmetric Skin: Skin color, texture, turgor normal. No rashes or lesions Neurologic: Grossly normal Psych: Pleasant  EKG: Sinus bradycardia 56, inferior and anterior infarct pattern-personally reviewed  ASSESSMENT: 1. Ischemic cardiomyopathy, EF 40%, with inferior and apical scar, NYHA Class I symptoms 2. Hypertension 3. Hyperlipidemia 4. Continued tobacco  abuse 5. ED 6. BPH 7. Probable COPD 8. Mild carotid artery disease  PLAN: 1.   [name] unfortunately continues to smoke.  Hopefully if he were to stop his 12-hour work days which he said may occur in the next year, he would be able to cut back.  Blood pressure is well controlled.  He is due for repeat lipid assessment which we will do today.  He does have  mild carotid artery disease and a right carotid bruit.  He will need a repeat carotid Doppler which we will order today.  In addition he has not had reassessment of his LVEF since 2016.  We will repeat an echo as well.  He notes that he has a lot of bills now as he is supporting his girlfriends medical problems.  Hopefully he will be able to follow through with testing for his own medical issues.  Follow up annually with me.  Chrystie Nose, MD, [facility], FACP    [facility]  Medical Director of the Advanced Lipid Disorders &  Cardiovascular Risk Reduction Clinic Diplomate of the American Board of Clinical Lipidology Attending Cardiologist  Direct Dial: [phone]  Fax: [phone]  Website:  [email or web address]   Lisette Abu Chayim Bialas 12/27/2017, 8:14 AM

## 2017-12-27 NOTE — Patient Instructions (Addendum)
Your physician recommends that you return for lab work to check your cholesterol & comprehensive metabolic panel. You will need to be fasting for this blood work.   Your physician has requested that you have a carotid duplex. This test is an ultrasound of the carotid arteries in your neck. It looks at blood flow through these arteries that supply the brain with blood. Allow one hour for this exam. There are no restrictions or special instructions. -- done at Dr. Blanchie DessertHilty's office  Your physician has requested that you have an echocardiogram. Echocardiography is a painless test that uses sound waves to create images of your heart. It provides your doctor with information about the size and shape of your heart and how well your heart's chambers and valves are working. This procedure takes approximately one hour. There are no restrictions for this procedure. -- done at [address]  Your physician wants you to follow-up in: ONE YEAR with Dr. Rennis GoldenHilty. You will receive a reminder letter in the mail two months in advance. If you don't receive a letter, please call our office to schedule the follow-up appointment.

## 2017-12-28 ENCOUNTER — Other Ambulatory Visit: Payer: Self-pay

## 2017-12-28 DIAGNOSIS — E785 Hyperlipidemia, unspecified: Secondary | ICD-10-CM

## 2017-12-28 MED ORDER — EZETIMIBE 10 MG PO TABS: 10.0000 mg | ORAL_TABLET | Freq: Every day | ORAL | 3 refills | Status: DC

## 2017-12-28 NOTE — Progress Notes (Signed)
Notes recorded by Chrystie NoseHilty, Kenneth C, MD on 12/27/2017 at 4:40 PM EDT Cholesterol is elevated compared to last year. Needs to work on lowering saturated fat in diet. He reported compliance with Simvastatin 80 mg. Would also recommend adding zetia 10 mg daily. Repeat lipid in 3 months.  Dr HRexene Edison

## 2018-01-03 ENCOUNTER — Other Ambulatory Visit: Payer: Self-pay | Admitting: Internal Medicine

## 2018-01-04 ENCOUNTER — Other Ambulatory Visit: Payer: Self-pay | Admitting: Internal Medicine

## 2018-02-14 ENCOUNTER — Inpatient Hospital Stay (HOSPITAL_COMMUNITY): Admission: RE | Admit: 2018-02-14 | Payer: 59 | Source: Ambulatory Visit

## 2018-02-14 ENCOUNTER — Other Ambulatory Visit (HOSPITAL_COMMUNITY): Payer: 59

## 2018-02-26 ENCOUNTER — Other Ambulatory Visit: Payer: Self-pay | Admitting: Internal Medicine

## 2018-03-14 ENCOUNTER — Other Ambulatory Visit: Payer: Self-pay | Admitting: Internal Medicine

## 2018-04-04 ENCOUNTER — Telehealth: Payer: Self-pay

## 2018-04-04 ENCOUNTER — Other Ambulatory Visit: Payer: Self-pay

## 2018-04-04 ENCOUNTER — Ambulatory Visit (HOSPITAL_COMMUNITY)
Admission: RE | Admit: 2018-04-04 | Discharge: 2018-04-04 | Disposition: A | Discharge status: Home / Self Care | Payer: Managed Care, Other (non HMO) | Source: Ambulatory Visit | Attending: Cardiology | Admitting: Cardiology

## 2018-04-04 ENCOUNTER — Other Ambulatory Visit: Payer: Self-pay | Admitting: Internal Medicine

## 2018-04-04 ENCOUNTER — Ambulatory Visit (HOSPITAL_BASED_OUTPATIENT_CLINIC_OR_DEPARTMENT_OTHER): Payer: Managed Care, Other (non HMO)

## 2018-04-04 DIAGNOSIS — I255 Ischemic cardiomyopathy: Secondary | ICD-10-CM | POA: Diagnosis present

## 2018-04-04 DIAGNOSIS — I6523 Occlusion and stenosis of bilateral carotid arteries: Secondary | ICD-10-CM | POA: Insufficient documentation

## 2018-04-04 MED ORDER — PERFLUTREN LIPID MICROSPHERE
1.0000 mL | INTRAVENOUS | Status: AC | PRN
  Administered 2018-04-04: 3 mL via INTRAVENOUS

## 2018-04-04 MED ORDER — EZETIMIBE 10 MG PO TABS: 10.0000 mg | ORAL_TABLET | Freq: Every day | ORAL | 3 refills | Status: DC

## 2018-04-04 MED ORDER — SILDENAFIL CITRATE 20 MG PO TABS: ORAL_TABLET | ORAL | 1 refills | Status: DC

## 2018-04-04 NOTE — Telephone Encounter (Signed)
Prescription sent for Sildenafil.

## 2018-04-04 NOTE — Telephone Encounter (Signed)
Pt requesting refill for Zetia and Sildenafil. Prescription sent for Zetia. Will route to MD for approval to fill Sildenafil.

## 2018-04-04 NOTE — Telephone Encounter (Signed)
OK to refill Sildenafil.  Dr. HRexene Edison

## 2018-04-15 ENCOUNTER — Encounter: Payer: Self-pay | Admitting: Internal Medicine

## 2018-04-15 ENCOUNTER — Ambulatory Visit: Payer: Managed Care, Other (non HMO) | Admitting: Internal Medicine

## 2018-04-15 VITALS — BP 146/83 | HR 66 | Ht 72.0 in | Wt 178.0 lb

## 2018-04-15 DIAGNOSIS — I771 Stricture of artery: Secondary | ICD-10-CM | POA: Diagnosis not present

## 2018-04-15 DIAGNOSIS — I6523 Occlusion and stenosis of bilateral carotid arteries: Secondary | ICD-10-CM

## 2018-04-15 DIAGNOSIS — Z79899 Other long term (current) drug therapy: Secondary | ICD-10-CM | POA: Diagnosis not present

## 2018-04-15 DIAGNOSIS — I255 Ischemic cardiomyopathy: Secondary | ICD-10-CM | POA: Diagnosis not present

## 2018-04-15 MED ORDER — SACUBITRIL-VALSARTAN 49-51 MG PO TABS: 1.0000 | ORAL_TABLET | Freq: Two times a day (BID) | ORAL | 11 refills | Status: DC

## 2018-04-15 NOTE — Patient Instructions (Signed)
Medication Instructions:  STOP lisinopril for 3 days then START entresto 49-51mg  (twice daily) -- please take free 30 day card to pharmacy -- please take co-pay card to pharmacy If you need a refill on your cardiac medications before your next appointment, please call your pharmacy.   Lab work: BMET - non-fasting lab work - after you have been on entresto for 1 week If you have labs (blood work) drawn today and your tests are completely normal, you will receive your results only by: Marland Kitchen. MyChart Message (if you have MyChart) OR . A paper copy in the mail If you have any lab test that is abnormal or we need to change your treatment, we will call you to review the results.  Follow-Up: At [facility], you and your health needs are our priority.  As part of our continuing mission to provide you with exceptional heart care, we have created designated Provider Care Teams.  These Care Teams include your primary Cardiologist (physician) and Advanced Practice Providers (APPs -  Physician Assistants and Nurse Practitioners) who all work together to provide you with the care you need, when you need it. You will need a follow up appointment in 4-6 weeks.  You may see Dr. Rennis GoldenHilty or one of the following Advanced Practice Providers on your designated Care Team: Azalee CourseHao Meng, [place]PA-C . Micah FlesherAngela Duke, PA-C  Any Other Special Instructions Will Be Listed Below (If Applicable).

## 2018-04-15 NOTE — Progress Notes (Signed)
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 He has a history of a large anteroapical infarct in the past with a low EF of less than 40%, actually 39% at his last stress test and New York Heart Association Class I-II symptoms. He continues to be generally asymptomatic and can do a high level of exercise. He is here today again for DOT physical and underwent exercise nuclear stress testing on December 12, 2010, with 8 minutes of exercise and 10 metabolic equivalents. The stress test demonstrated a moderate to severe infarct in the apical anterior, apical basal, inferoseptal, inferior, mid-inferoseptal, mid-inferior, and apical inferior regions, which is thought to be unchanged from his previous study. He describes no new cardiovascular symptoms. He is requesting another stress test per DOT guidelines.   Tyrone Stanton returns today for his annual visit. At his last office visit I recommended another stress test however it was declined by his insurance company. Probably because he is asymptomatic however it was requested per DOT guidelines which are not up-to-date. He continues to feel very well and does high level of work and exercise. He denies any chest pain or shortness of breath. We have not reassess his LV function and over 3 years and is due for repeat echocardiography. He is also not had a lipid assessment and over a year.  Tyrone Stanton returns today for follow-up. At his last office visit he had reassessment of LV function by echo which showed EF stable at 40%. Fortunately continues to smoke. 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 He continues to have problems with erectile dysfunction but seems to be well-controlled with Viagra. He is not  yet established a primary care provider. He is previously seen a urologist [name], but has not followed up.  11/22/2015  Tyrone Stanton returns today for follow-up. Over the past year he's had no new complaints. He continues to work long days and has no problems with chest pain or worsening shortness of breath. He is in need of a refill of his Revatio. He is not establish with a primary care provider. I asked him whether he never had screening colonoscopy and he has not yet. I will therefore refer him for that. 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 He's not had repeat imaging since that time. He continues to smoke about 1-1/2 packs per day. I reviewed current screening guidelines which indicate a recommendation for a CT scan of the chest and smokers that are in their 60s. I'm recommending him to get a CT scan to screen for early malignancies.  11/26/2016  Tyrone Stanton was seen today in follow-up. He reports feeling well. He says he's somewhat interested in stopping smoking however has not yet been able to do that. Recently had surgery to remove a cyst from his testicle which was benign. He is followed with [facility]. He denies any worsening chest pain. He does have shortness of breath but it's been stable. He's not had any lab work but recently had a DOT physical. Weight is down about 5 pounds and his BMI is normal. He is due for repeat lipid profile. He does not have a primary care provider.  12/27/2017  Tyrone Stanton was seen today in routine follow-up.  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  Because of this he says is not been able to stop smoking.  He is also not been able to cut back.  He denies any new chest pain or worsening shortness of breath.  We have discussed multiple times the fact that we need to continue to follow-up carotid Dopplers.  He has a right carotid bruit which  showed mild plaque in 2014 but has not been reassessed.  In addition his last echo was in 2016 which showed LVEF about 40% with anterior MI.  He denies any new weight gain, orthopnea, PND or other heart failure symptoms.  He reports compliance with his medications.  He has not had recent lab work that we could obtain.  04/15/2018  Tyrone Stanton returns today for follow-up of his studies.  He underwent a repeat echocardiogram, which unfortunately shows a decline in LVEF down to 35%.  There is evidence of fixed defect suggestive of prior MI.  His EF previously was 45 to 50%.  In addition he had bilateral carotid Dopplers which showed mild disease however there is significant stenosis of the right subclavian artery.  This was manifest by differential blood pressures of about 20 to 25 mmHg systolic between the right and left arm.  In the future is recommended that left arm blood pressures be obtained.  He currently denies any chest pain or worsening shortness of breath.  He has no more than NYHA class II symptoms.  Past Medical History:  Diagnosis Date  . BPH with urinary obstruction   . Carotid bruit    asymptomatic ;Carotid doppler 2014 mild RICA plaque  . COPD with emphysema (HCC)   . Coronary artery disease cardiologist-  dr Saraiya Kozma   hx large anteroapical MI  s/p  PCI with DES x3  to mCFX, pRCA, and OM1--- last Echo-45-50%,LV normal  . Dyslipidemia   . ED (erectile dysfunction)   . Epididymal cyst    left  . Full dentures   . History of adenomatous polyp of colon    01-28-2016  tubular adenoma's  . History of anteroapical myocardial infarction 12-29-2007    dr berry   pt entirely asymptomatic w/  positive Myoview--- per Cardiac Cath  3 vessel disease w/ mild to mod. LV dyfunction  . Hypertension   . Ischemic cardiomyopathy per echo 04-18-2015 -- ef 45%   12-12-2010  Exercise stress test-EF 37% mod. to severe perfusion defect to infarct \\scar  with mild perinfarct ischemia -apical anterior,apical  basal inferioseptal, basal inferior, mid inferoseptal, mid inferior and apical inferior regions. the post stess LV  normal.;New York Heart Assoc. class l and II symptoms  . LAFB (left anterior fascicular block)   . S/P drug eluting coronary stent placement 01/11/2008   x3  to midCFX,  pRCA, and OM1  . Smokers' cough (HCC)    intermittant productive  . Weak urinary stream   . Wears glasses     Past Surgical History:  Procedure Laterality Date  . CARDIAC CATHETERIZATION  12/29/2007   dr berry   LAD-long segmental 50 to 60% after the first mod. diagonal branch btwn ST1 and ST2; Lge OM2 80% proximal; RCA,RV branch 80 to 90% mid; RCA gave off PDA and PLA  appeared  to be 90% at the origin   . CARDIOVASCULAR STRESS TEST  12/12/2010  dr berry   Abnormal Low Risk  nuclear study (compared to previous study, no signigicant change) moderate to severe perfusion defect , scar/infarct, w/ mild perinfarct ischemia in the apical anterior, apical, basal inferoseptal , basal inferor, mid inferoseptal, mid inferor and apical inferior regions/ post-stress EF 37% w/ dyskinesis in the apical regions & moderate hypokinesi basal & mid inferior regions  . COLONOSCOPY  01/28/2016  . CORONARY ANGIOPLASTY WITH STENT PLACEMENT  09/092009    dr berry   Cutting balloon atherectomy Mid Circ 2.5x12 Promus; Ostium of the 1st marginal branch angoiplasty result 20-30%;RCA stented 2.5 x 15 Promus post dilated w/ 2.75x12 [place] reduce80-90% proximal dominanat RCAto 0% (total DES x3)  . EPIDIDYMECTOMY Left 06/05/2016   Procedure: LEFT SCROTAL EXPLORATION WITH MULTIPLE REMOVALS OF EPIDIDYMAL CYSTS;  Surgeon: Jethro Bolus, MD;  Location: [facility];  Service: Urology;  Laterality: Left;  . LUMBAR LAMINECTOMY  1988  . TRANSTHORACIC ECHOCARDIOGRAM  04/18/2015   very mild LVH,  ef 45%, akinesis of the mid anterior, mid anteroseptal , and apical septal ; severe hypokinesis of the apical inferior'; moderate  hypokinesis of the apical anterior;  mild hypokinesis of the mid anterolateral, apical lateral, and apical;  grade 2 diastolic dysfunction/  mild to moderate AV calcification without stenosis/  mild MR and TR/  mild reduced RVSF/      FAMHx:  Family History  Problem Relation Age of Onset  . Stroke Mother   . Cancer Mother   . Heart disease Father     SOCHx:   reports that he has been smoking cigarettes. He has a 94.00 pack-year smoking history. He has never used smokeless tobacco. He reports that he does not drink alcohol or use drugs.  ALLERGIES:  No Known Allergies  ROS: Pertinent items noted in HPI and remainder of comprehensive ROS otherwise negative.  HOME MEDS: Current Outpatient Medications  Medication Sig Dispense Refill  . aspirin 81 MG tablet Take 81 mg by mouth daily.    . carvedilol (COREG) 12.5 MG tablet Take 1 tablet (12.5 mg total) by mouth 2 (two) times daily. 180 tablet 3  . clopidogrel (PLAVIX) 75 MG tablet TAKE 1 TABLET BY MOUTH EVERY DAY 90 tablet 3  . ezetimibe (ZETIA) 10 MG tablet Take 1 tablet (10 mg total) by mouth daily. 90 tablet 3  . lisinopril (PRINIVIL,ZESTRIL) 20 MG tablet TAKE 1 TABLET BY MOUTH EVERY DAY 90 tablet 3  . sildenafil (REVATIO) 20 MG tablet TAKE 2 TO 3 TABLETS (40-60 MG TOTAL) BY MOUTH AS NEEDED FOR SEXUAL ACTIVITY 50 tablet 1  . simvastatin (ZOCOR) 80 MG tablet TAKE 1 TABLET (80 MG TOTAL) BY MOUTH AT BEDTIME. 90 tablet 3   No current facility-administered medications for this visit.    Facility-Administered Medications Ordered in Other Visits  Medication Dose Route Frequency Provider Last Rate Last Dose  . acetaminophen (TYLENOL) tablet 975 mg  975 mg Oral Q6H PRN Jethro Bolus, MD        LABS/IMAGING: No results found for this or any previous visit (from the past 48 hour(s)). No results found.  VITALS: BP (!) 146/83 (BP Location: Left Arm)   Pulse 66   Ht 6' (1.829 m)   Wt 178 lb (80.7 kg)   BMI 24.14 kg/m    EXAM: Deferred  EKG: Deferred  ASSESSMENT: 1. Ischemic cardiomyopathy, EF 35%, with inferior and apical scar, NYHA Class I-II symptoms 2. Hypertension 3. Hyperlipidemia 4. Continued tobacco abuse 5. ED 6. BPH 7. Probable COPD 8. Mild carotid artery disease  9. Right subclavian artery stenosis  PLAN: 1.   Tyrone Stanton is found to have had worsening LVEF down to 35%.  He is generally asymptomatic but could get short of breath with marked exertion.  I would recommend changing his lisinopril over to Entresto 49/51 mg.  He understands that he will need to be off of lisinopril for about 3 days to washout prior to starting Entresto.  We will plan to see him back in 4 to 6 weeks to uptitrate the dose as tolerated.  After maximal medical therapy will plan a repeat LV assessment with a cardiac MRI in about 6 months.  This will give us a better idea of the extent of his scar and if his EF is truly 35% or less, possibly at that point making him a candidate for an AICD for primary prevention of sudden cardiac death.  Follow-up with me in 4 to 6 weeks.  Tyrone NoseKenneth C. Taunja Brickner, MD, South Pointe HospitalFACC, FACP  [place]  [facility]  Medical Director of the Advanced Lipid Disorders &  Cardiovascular Risk Reduction Clinic Diplomate of the American Board of Clinical Lipidology Attending Cardiologist  Direct Dial: [phone]  Fax: [phone]  Website:  [email or web address]   Bayani Renteria C Defne Gerling 04/15/2018, 11:33 AM

## 2018-04-26 LAB — BASIC METABOLIC PANEL
BUN/Creatinine Ratio: 13 (ref 10–24)
BUN: 10 mg/dL (ref 8–27)
CO2: 25 mmol/L (ref 20–29)
Calcium: 9.4 mg/dL (ref 8.6–10.2)
Chloride: 101 mmol/L (ref 96–106)
Creatinine, Ser: 0.8 mg/dL (ref 0.76–1.27)
GFR calc Af Amer: 109 mL/min/{1.73_m2} (ref >59)
GFR calc non Af Amer: 94 mL/min/{1.73_m2} (ref >59)
Glucose: 83 mg/dL (ref 65–99)
Potassium: 4.8 mmol/L (ref 3.5–5.2)
Sodium: 139 mmol/L (ref 134–144)

## 2018-04-28 ENCOUNTER — Encounter: Payer: Self-pay | Admitting: *Deleted

## 2018-05-24 ENCOUNTER — Encounter (INDEPENDENT_AMBULATORY_CARE_PROVIDER_SITE_OTHER): Payer: Self-pay

## 2018-05-24 ENCOUNTER — Encounter: Payer: Self-pay | Admitting: Internal Medicine

## 2018-05-24 ENCOUNTER — Ambulatory Visit: Payer: Managed Care, Other (non HMO) | Admitting: Internal Medicine

## 2018-05-24 VITALS — BP 110/72 | HR 73 | Ht 72.0 in | Wt 185.2 lb

## 2018-05-24 DIAGNOSIS — I2510 Atherosclerotic heart disease of native coronary artery without angina pectoris: Secondary | ICD-10-CM | POA: Diagnosis not present

## 2018-05-24 DIAGNOSIS — I2583 Coronary atherosclerosis due to lipid rich plaque: Secondary | ICD-10-CM

## 2018-05-24 DIAGNOSIS — I255 Ischemic cardiomyopathy: Secondary | ICD-10-CM | POA: Diagnosis not present

## 2018-05-24 DIAGNOSIS — E782 Mixed hyperlipidemia: Secondary | ICD-10-CM | POA: Diagnosis not present

## 2018-05-24 DIAGNOSIS — I10 Essential (primary) hypertension: Secondary | ICD-10-CM | POA: Diagnosis not present

## 2018-05-24 NOTE — Addendum Note (Signed)
Addended by: Chrystie Nose on: 05/24/2018 08:34 AM   Modules accepted: Orders

## 2018-05-24 NOTE — Patient Instructions (Signed)
Medication Instructions:  No medication changes  If you need a refill on your cardiac medications before your next appointment, please call your pharmacy.   Lab work: Not needed If you have labs (blood work) drawn today and your tests are completely normal, you will receive your results only by: Marland Kitchen MyChart Message (if you have MyChart) OR . A paper copy in the mail If you have any lab test that is abnormal or we need to change your treatment, we will call you to review the results.  Testing/Procedures: Schedule in July 2020 - at [facility] RADILOLOGY Your physician has requested that you have a cardiac MRI. Cardiac MRI uses a computer to create images of your heart as its beating, producing both still and moving pictures of your heart and major blood vessels. For further information please visit [email or web address]. Please follow the instruction sheet given to you today for more information.  Follow-Up: At [facility], you and your health needs are our priority.  As part of our continuing mission to provide you with exceptional heart care, we have created designated Provider Care Teams.  These Care Teams include your primary Cardiologist (physician) and Advanced Practice Providers (APPs -  Physician Assistants and Nurse Practitioners) who all work together to provide you with the care you need, when you need it. You will need a follow up appointment in 6 months July 2020 AFTER CARDIAC MRI IS COMPLETED.  Please call our office 2 months in advance to schedule this appointment.  You may see Dr Rennis Golden or one of the following Advanced Practice Providers on your designated Care Team: Azalee Course, [place] . Micah Flesher, PA-C  Any Other Special Instructions Will Be Listed Below (If Applicable).

## 2018-05-24 NOTE — Progress Notes (Signed)
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 He has a history of a large anteroapical infarct in the past with a low EF of less than 40%, actually 39% at his last stress test and New York Heart Association Class I-II symptoms. He continues to be generally asymptomatic and can do a high level of exercise. He is here today again for DOT physical and underwent exercise nuclear stress testing on December 12, 2010, with 8 minutes of exercise and 10 metabolic equivalents. The stress test demonstrated a moderate to severe infarct in the apical anterior, apical basal, inferoseptal, inferior, mid-inferoseptal, mid-inferior, and apical inferior regions, which is thought to be unchanged from his previous study. He describes no new cardiovascular symptoms. He is requesting another stress test per DOT guidelines.   Tyrone Stanton returns today for his annual visit. At his last office visit I recommended another stress test however it was declined by his insurance company. Probably because he is asymptomatic however it was requested per DOT guidelines which are not up-to-date. He continues to feel very well and does high level of work and exercise. He denies any chest pain or shortness of breath. We have not reassess his LV function and over 3 years and is due for repeat echocardiography. He is also not had a lipid assessment and over a year.  Tyrone Stanton returns today for follow-up. At his last office visit he had reassessment of LV function by echo which showed EF stable at 40%. Fortunately continues to smoke. 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 He continues to have problems with erectile dysfunction but seems to be well-controlled with Viagra. He is  not yet established a primary care provider. He is previously seen a urologist [name], but has not followed up.  11/22/2015  Tyrone Stanton returns today for follow-up. Over the past year he's had no new complaints. He continues to work long days and has no problems with chest pain or worsening shortness of breath. He is in need of a refill of his Revatio. He is not establish with a primary care provider. I asked him whether he never had screening colonoscopy and he has not yet. I will therefore refer him for that. 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 He's not had repeat imaging since that time. He continues to smoke about 1-1/2 packs per day. I reviewed current screening guidelines which indicate a recommendation for a CT scan of the chest and smokers that are in their 60s. I'm recommending him to get a CT scan to screen for early malignancies.  11/26/2016  Tyrone Stanton was seen today in follow-up. He reports feeling well. He says he's somewhat interested in stopping smoking however has not yet been able to do that. Recently had surgery to remove a cyst from his testicle which was benign. He is followed with [facility]. He denies any worsening chest pain. He does have shortness of breath but it's been stable. He's not had any lab work but recently had a DOT physical. Weight is down about 5 pounds and his BMI is normal. He is due for repeat lipid profile. He does not have a primary care provider.  12/27/2017  Tyrone Stanton was seen today in routine follow-up.  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  Because of this he says is not been able to stop smoking.  He is also not been able to cut back.  He denies any new chest pain or worsening shortness of breath.  We have discussed multiple times the fact that we need to continue to follow-up carotid Dopplers.  He has a right carotid bruit  which showed mild plaque in 2014 but has not been reassessed.  In addition his last echo was in 2016 which showed LVEF about 40% with anterior MI.  He denies any new weight gain, orthopnea, PND or other heart failure symptoms.  He reports compliance with his medications.  He has not had recent lab work that we could obtain.  04/15/2018  Tyrone Stanton returns today for follow-up of his studies.  He underwent a repeat echocardiogram, which unfortunately shows a decline in LVEF down to 35%.  There is evidence of fixed defect suggestive of prior MI.  His EF previously was 45 to 50%.  In addition he had bilateral carotid Dopplers which showed mild disease however there is significant stenosis of the right subclavian artery.  This was manifest by differential blood pressures of about 20 to 25 mmHg systolic between the right and left arm.  In the future is recommended that left arm blood pressures be obtained.  He currently denies any chest pain or worsening shortness of breath.  He has no more than NYHA class II symptoms.  05/24/2018  Tyrone Stanton is seen today in follow-up.  He seems to be tolerating the switch from lisinopril to [facility].  He is currently on the 49/51 mg dose.  Blood pressure is much better controlled now 110/72.  He is also on carvedilol 12.5 mg twice daily, aspirin, Plavix, Zetia and simvastatin.  Overall he feels well.  Denies any worsening shortness of breath or any significant side effects from the [place].  At this point however I do not believe we can titrate up the medication much more.  He says his blood pressure is even a little lower at home.  Past Medical History:  Diagnosis Date  . BPH with urinary obstruction   . Carotid bruit    asymptomatic ;Carotid doppler 2014 mild RICA plaque  . COPD with emphysema (HCC)   . Coronary artery disease cardiologist-  dr hilty   hx large anteroapical MI  s/p  PCI with DES x3  to mCFX, pRCA, and OM1--- last Echo-45-50%,LV normal  .  Dyslipidemia   . ED (erectile dysfunction)   . Epididymal cyst    left  . Full dentures   . History of adenomatous polyp of colon    01-28-2016  tubular adenoma's  . History of anteroapical myocardial infarction 12-29-2007    dr berry   pt entirely asymptomatic w/  positive Myoview--- per Cardiac Cath  3 vessel disease w/ mild to mod. LV dyfunction  . Hypertension   . Ischemic cardiomyopathy per echo 04-18-2015 -- ef 45%   12-12-2010  Exercise stress test-EF 37% mod. to severe perfusion defect to infarct \\scar  with mild perinfarct ischemia -apical anterior,apical basal inferioseptal, basal inferior, mid inferoseptal, mid inferior and apical inferior regions. the post stess LV  normal.;New York Heart Assoc. class l and II symptoms  . LAFB (left anterior fascicular block)   . S/P drug eluting coronary stent placement 01/11/2008   x3  to midCFX,  pRCA, and OM1  . Smokers' cough (HCC)    intermittant productive  . Weak urinary stream   . Wears glasses     Past Surgical History:  Procedure Laterality Date  . CARDIAC CATHETERIZATION  12/29/2007   dr berry   LAD-long segmental 50 to 60% after the first mod. diagonal branch btwn ST1 and ST2; Lge OM2 80% proximal; RCA,RV branch 80 to 90% mid; RCA gave off PDA and PLA  appeared  to be 90% at the origin   . CARDIOVASCULAR STRESS TEST  12/12/2010  dr berry   Abnormal Low Risk nuclear study (compared to previous study, no signigicant change) moderate to severe perfusion defect , scar/infarct, w/ mild perinfarct ischemia in the apical anterior, apical, basal inferoseptal , basal inferor, mid inferoseptal, mid inferor and apical inferior regions/ post-stress EF 37% w/ dyskinesis in the apical regions & moderate hypokinesi basal & mid inferior regions  . COLONOSCOPY  01/28/2016  . CORONARY ANGIOPLASTY WITH STENT PLACEMENT  09/092009    dr berry   Cutting balloon atherectomy Mid Circ 2.5x12 Promus; Ostium of the 1st marginal branch angoiplasty result  20-30%;RCA stented 2.5 x 15 Promus post dilated w/ 2.75x12 [place] reduce80-90% proximal dominanat RCAto 0% (total DES x3)  . EPIDIDYMECTOMY Left 06/05/2016   Procedure: LEFT SCROTAL EXPLORATION WITH MULTIPLE REMOVALS OF EPIDIDYMAL CYSTS;  Surgeon: Jethro BolusSigmund Tannenbaum, MD;  [facility] [place];  Service: Urology;  Laterality: Left;  . LUMBAR LAMINECTOMY  1988  . TRANSTHORACIC ECHOCARDIOGRAM  04/18/2015   very mild LVH,  ef 45%, akinesis of the mid anterior, mid anteroseptal , and apical septal ; severe hypokinesis of the apical inferior'; moderate hypokinesis of the apical anterior;  mild hypokinesis of the mid anterolateral, apical lateral, and apical;  grade 2 diastolic dysfunction/  mild to moderate AV calcification without stenosis/  mild MR and TR/  mild reduced RVSF/      FAMHx:  Family History  Problem Relation Age of Onset  . Stroke Mother   . Cancer Mother   . Heart disease Father     SOCHx:   reports that he has been smoking cigarettes. He has a 94.00 pack-year smoking history. He has never used smokeless tobacco. He reports that he does not drink alcohol or use drugs.  ALLERGIES:  No Known Allergies  ROS: Pertinent items noted in HPI and remainder of comprehensive ROS otherwise negative.  HOME MEDS: Current Outpatient Medications  Medication Sig Dispense Refill  . aspirin 81 MG tablet Take 81 mg by mouth daily.    . carvedilol (COREG) 12.5 MG tablet Take 1 tablet (12.5 mg total) by mouth 2 (two) times daily. 180 tablet 3  . clopidogrel (PLAVIX) 75 MG tablet TAKE 1 TABLET BY MOUTH EVERY DAY 90 tablet 3  . ezetimibe (ZETIA) 10 MG tablet Take 1 tablet (10 mg total) by mouth daily. 90 tablet 3  . sacubitril-valsartan (ENTRESTO) 49-51 MG Take 1 tablet by mouth 2 (two) times daily. 60 tablet 11  . sildenafil (REVATIO) 20 MG tablet TAKE 2 TO 3 TABLETS (40-60 MG TOTAL) BY MOUTH AS NEEDED FOR SEXUAL ACTIVITY 50 tablet 1  . simvastatin (ZOCOR) 80 MG tablet TAKE 1  TABLET (80 MG TOTAL) BY MOUTH AT BEDTIME. 90 tablet 3   No current facility-administered medications for this visit.    Facility-Administered Medications Ordered in Other Visits  Medication Dose Route Frequency Provider Last Rate Last Dose  . acetaminophen (TYLENOL) tablet 975 mg  975  mg Oral Q6H PRN Jethro Bolus, MD        LABS/IMAGING: No results found for this or any previous visit (from the past 48 hour(s)). No results found.  VITALS: BP 110/72   Pulse 73   Ht 6' (1.829 m)   Wt 185 lb 3.2 oz (84 kg)   BMI 25.12 kg/m   EXAM: Deferred  EKG: Deferred  ASSESSMENT: 1. Ischemic cardiomyopathy, EF 35%, with inferior and apical scar, NYHA Class I-II symptoms 2. Hypertension 3. Hyperlipidemia 4. Continued tobacco abuse 5. ED 6. BPH 7. Probable COPD 8. Mild carotid artery disease 9. Right subclavian artery stenosis  PLAN: 1.   Mr. Eltz is doing well with on Entresto.  I hope that this will improve his heart function.  Because he has borderline criteria for AICD with EF around 35%, it was suggested that cardiac MRI may be helpful for more definitive LV functional assessment.  It will also help [place] better visualize prior inferior and apical scar which was suggested by stress testing.  We will plan to continue current therapy and schedule cardiac MRI in 6 months.  At that time if his LVEF remains depressed on maximal medical therapy then I would refer him for AICD for primary prevention of sudden cardiac death.  Chrystie Nose, MD, [facility], FACP  [place]  [facility]  Medical Director of the [facility]  [facility] Diplomate of the American Board of Clinical Lipidology Attending Cardiologist  Direct Dial: [phone]  Fax: [phone]  Website:  [email or web address] Nicely Hilty 05/24/2018, 8:17 AM

## 2018-08-27 ENCOUNTER — Other Ambulatory Visit: Payer: Self-pay | Admitting: Internal Medicine

## 2018-11-30 ENCOUNTER — Encounter: Payer: Self-pay | Admitting: Internal Medicine

## 2018-12-19 ENCOUNTER — Telehealth (HOSPITAL_COMMUNITY): Payer: Self-pay | Admitting: Emergency Medicine

## 2018-12-19 NOTE — Telephone Encounter (Signed)
Reaching out to patient to offer assistance regarding upcoming cardiac imaging study; pt verbalizes understanding of appt date/time, parking situation and where to check in, and verified current allergies; name and call back number provided for further questions should they arise Bernardina Cacho RN Navigator Cardiac Imaging [facility] [phone] office [phone] cell 

## 2018-12-20 ENCOUNTER — Ambulatory Visit (HOSPITAL_COMMUNITY)
Admission: RE | Admit: 2018-12-20 | Discharge: 2018-12-20 | Disposition: A | Discharge status: Home / Self Care | Payer: Managed Care, Other (non HMO) | Source: Ambulatory Visit | Attending: Internal Medicine | Admitting: Internal Medicine

## 2018-12-20 ENCOUNTER — Other Ambulatory Visit: Payer: Self-pay

## 2018-12-20 DIAGNOSIS — I255 Ischemic cardiomyopathy: Secondary | ICD-10-CM | POA: Diagnosis not present

## 2018-12-20 LAB — CREATININE, SERUM
Creatinine, Ser: 0.86 mg/dL (ref 0.61–1.24)
GFR calc Af Amer: >60 mL/min (ref >60)
GFR calc non Af Amer: >60 mL/min (ref >60)

## 2018-12-20 MED ORDER — GADOBUTROL 1 MMOL/ML IV SOLN
10.0000 mL | Freq: Once | INTRAVENOUS | Status: AC | PRN
  Administered 2018-12-20: 13:00:00 10 mL via INTRAVENOUS

## 2018-12-25 ENCOUNTER — Other Ambulatory Visit: Payer: Self-pay | Admitting: Internal Medicine

## 2019-02-02 ENCOUNTER — Other Ambulatory Visit: Payer: Self-pay

## 2019-02-02 ENCOUNTER — Ambulatory Visit: Payer: Managed Care, Other (non HMO) | Admitting: Internal Medicine

## 2019-02-02 ENCOUNTER — Encounter: Payer: Self-pay | Admitting: Internal Medicine

## 2019-02-02 VITALS — BP 130/74 | HR 68 | Ht 72.0 in | Wt 173.0 lb

## 2019-02-02 DIAGNOSIS — E782 Mixed hyperlipidemia: Secondary | ICD-10-CM

## 2019-02-02 DIAGNOSIS — I2510 Atherosclerotic heart disease of native coronary artery without angina pectoris: Secondary | ICD-10-CM

## 2019-02-02 DIAGNOSIS — I10 Essential (primary) hypertension: Secondary | ICD-10-CM

## 2019-02-02 DIAGNOSIS — I6523 Occlusion and stenosis of bilateral carotid arteries: Secondary | ICD-10-CM

## 2019-02-02 DIAGNOSIS — I255 Ischemic cardiomyopathy: Secondary | ICD-10-CM | POA: Diagnosis not present

## 2019-02-02 DIAGNOSIS — I2583 Coronary atherosclerosis due to lipid rich plaque: Secondary | ICD-10-CM | POA: Diagnosis not present

## 2019-02-02 DIAGNOSIS — I5022 Chronic systolic (congestive) heart failure: Secondary | ICD-10-CM | POA: Diagnosis not present

## 2019-02-02 NOTE — Progress Notes (Signed)
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 He has a history of a large anteroapical infarct in the past with a low EF of less than 40%, actually 39% at his last stress test and New York Heart Association Class I-II symptoms. He continues to be generally asymptomatic and can do a high level of exercise. He is here today again for DOT physical and underwent exercise nuclear stress testing on December 12, 2010, with 8 minutes of exercise and 10 metabolic equivalents. The stress test demonstrated a moderate to severe infarct in the apical anterior, apical basal, inferoseptal, inferior, mid-inferoseptal, mid-inferior, and apical inferior regions, which is thought to be unchanged from his previous study. He describes no new cardiovascular symptoms. He is requesting another stress test per DOT guidelines.   Tyrone Stanton returns today for his annual visit. At his last office visit I recommended another stress test however it was declined by his insurance company. Probably because he is asymptomatic however it was requested per DOT guidelines which are not up-to-date. He continues to feel very well and does high level of work and exercise. He denies any chest pain or shortness of breath. We have not reassess his LV function and over 3 years and is due for repeat echocardiography. He is also not had a lipid assessment and over a year.  Tyrone Stanton returns today for follow-up. At his last office visit he had reassessment of LV function by echo which showed EF stable at 40%. Fortunately continues to smoke. 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 He continues to have problems with erectile dysfunction but seems to be well-controlled with Viagra. He is not yet  established a primary care provider. He is previously seen a urologist Dr. Brunilda PayorNesi, but has not followed up.  11/22/2015  Tyrone Stanton returns today for follow-up. Over the past year he's had no new complaints. He continues to work long days and has no problems with chest pain or worsening shortness of breath. He is in need of a refill of his Revatio. He is not establish with a primary care provider. I asked him whether he never had screening colonoscopy and he has not yet. I will therefore refer him for that. 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 He's not had repeat imaging since that time. He continues to smoke about 1-1/2 packs per day. I reviewed current screening guidelines which indicate a recommendation for a CT scan of the chest and smokers that are in their 60s. I'm recommending him to get a CT scan to screen for early malignancies.  11/26/2016  Tyrone Stanton was seen today in follow-up. He reports feeling well. He says he's somewhat interested in stopping smoking however has not yet been able to do that. Recently had surgery to remove a cyst from his testicle which was benign. He is followed with [facility]. He denies any worsening chest pain. He does have shortness of breath but it's been stable. He's not had any lab work but recently had a DOT physical. Weight is down about 5 pounds and his BMI is normal. He is due for repeat lipid profile. He does not have a primary care provider.  12/27/2017  Tyrone Stanton was seen today in routine follow-up.  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  Because of this he says is not been able to stop smoking.  He is also not been able to cut back.  He denies any new chest pain or worsening shortness of breath.  We have discussed multiple times the fact that we need to continue to follow-up carotid Dopplers.  He has a right carotid bruit which showed  mild plaque in 2014 but has not been reassessed.  In addition his last echo was in 2016 which showed LVEF about 40% with anterior MI.  He denies any new weight gain, orthopnea, PND or other heart failure symptoms.  He reports compliance with his medications.  He has not had recent lab work that we could obtain.  04/15/2018  Tyrone Stanton returns today for follow-up of his studies.  He underwent a repeat echocardiogram, which unfortunately shows a decline in LVEF down to 35%.  There is evidence of fixed defect suggestive of prior MI.  His EF previously was 45 to 50%.  In addition he had bilateral carotid Dopplers which showed mild disease however there is significant stenosis of the right subclavian artery.  This was manifest by differential blood pressures of about 20 to 25 mmHg systolic between the right and left arm.  In the future is recommended that left arm blood pressures be obtained.  He currently denies any chest pain or worsening shortness of breath.  He has no more than NYHA class II symptoms.  05/24/2018  Tyrone Stanton is seen today in follow-up.  He seems to be tolerating the switch from lisinopril to [facility].  He is currently on the 49/51 mg dose.  Blood pressure is much better controlled now 110/72.  He is also on carvedilol 12.5 mg twice daily, aspirin, Plavix, Zetia and simvastatin.  Overall he feels well.  Denies any worsening shortness of breath or any significant side effects from the [place].  At this point however I do not believe we can titrate up the medication much more.  He says his blood pressure is even a little lower at home.  02/02/2019  Tyrone Stanton is seen today in follow-up.  He underwent cardiac MRI in August 2020, which demonstrated an LVEF of 41% with inferolateral akinesis, septal apical and distal anterior wall hypokinesis.  There was full-thickness scar in the inferolateral wall from the apex to base and partial thickness scar in the distal anterior wall, apex and septum.   Overall this is a relatively good finding, considering he has known prior infarct and that his LVEF is probably as optimized as it could be discounting the areas of infarcted myocardium.  He says he is been pleased with his blood pressure control on Entresto, for which she is on moderate dose.  Blood pressure is well controlled at 130/74.  Weight is normal.  EKG shows a sinus rhythm with inferior and anterior infarct pattern.  Unfortunately continues to smoke.  We discussed that today.  At this time he is not quite ready to quit but he says he did quit for up to a year in the past and thinks he could do it again.  He had tried Chantix but had vivid dreams with it and discontinued it.  Past Medical History:  Diagnosis Date   BPH with urinary obstruction    Carotid bruit    asymptomatic ;Carotid doppler 2014 mild RICA plaque  COPD with emphysema [facility])    Coronary artery disease cardiologist-  dr Maley Venezia   hx large anteroapical MI  s/p  PCI with DES x3  to mCFX, pRCA, and OM1--- last Echo-45-50%,LV normal   Dyslipidemia    ED (erectile dysfunction)    Epididymal cyst    left   Full dentures    History of adenomatous polyp of colon    01-28-2016  tubular adenoma's   History of anteroapical myocardial infarction 12-29-2007    dr berry   pt entirely asymptomatic w/  positive Myoview--- per Cardiac Cath  3 vessel disease w/ mild to mod. LV dyfunction   Hypertension    Ischemic cardiomyopathy per echo 04-18-2015 -- ef 45%   12-12-2010  Exercise stress test-EF 37% mod. to severe perfusion defect to infarct \\scar  with mild perinfarct ischemia -apical anterior,apical basal inferioseptal, basal inferior, mid inferoseptal, mid inferior and apical inferior regions. the post stess LV  normal.;New York Heart Assoc. class l and II symptoms   LAFB (left anterior fascicular block)    S/P drug eluting coronary stent placement 01/11/2008   x3  to midCFX,  pRCA, and OM1   Smokers' cough (HCC)     intermittant productive   Weak urinary stream    Wears glasses     Past Surgical History:  Procedure Laterality Date   CARDIAC CATHETERIZATION  12/29/2007   dr berry   LAD-long segmental 50 to 60% after the first mod. diagonal branch btwn ST1 and ST2; Lge OM2 80% proximal; RCA,RV branch 80 to 90% mid; RCA gave off PDA and PLA  appeared  to be 90% at the origin    CARDIOVASCULAR STRESS TEST  12/12/2010  dr berry   Abnormal Low Risk nuclear study (compared to previous study, no signigicant change) moderate to severe perfusion defect , scar/infarct, w/ mild perinfarct ischemia in the apical anterior, apical, basal inferoseptal , basal inferor, mid inferoseptal, mid inferor and apical inferior regions/ post-stress EF 37% w/ dyskinesis in the apical regions & moderate hypokinesi basal & mid inferior regions   COLONOSCOPY  01/28/2016   CORONARY ANGIOPLASTY WITH STENT PLACEMENT  09/092009    dr berry   Cutting balloon atherectomy Mid Circ 2.5x12 Promus; Ostium of the 1st marginal branch angoiplasty result 20-30%;RCA stented 2.5 x 15 Promus post dilated w/ 2.75x12 [place] reduce80-90% proximal dominanat RCAto 0% (total DES x3)   EPIDIDYMECTOMY Left 06/05/2016   Procedure: LEFT SCROTAL EXPLORATION WITH MULTIPLE REMOVALS OF EPIDIDYMAL CYSTS;  Surgeon: Jethro Bolus, MD;  Location: [facility];  Service: Urology;  Laterality: Left;   LUMBAR LAMINECTOMY  1988   TRANSTHORACIC ECHOCARDIOGRAM  04/18/2015   very mild LVH,  ef 45%, akinesis of the mid anterior, mid anteroseptal , and apical septal ; severe hypokinesis of the apical inferior'; moderate hypokinesis of the apical anterior;  mild hypokinesis of the mid anterolateral, apical lateral, and apical;  grade 2 diastolic dysfunction/  mild to moderate AV calcification without stenosis/  mild MR and TR/  mild reduced RVSF/      FAMHx:  Family History  Problem Relation Age of Onset   Stroke Mother    Cancer Mother     Heart disease Father     SOCHx:   reports that he has been smoking cigarettes. He has a 94.00 pack-year smoking history. He has never used smokeless tobacco. He reports that he does not drink alcohol or use drugs.  ALLERGIES:  No Known Allergies  ROS: Pertinent items noted  in HPI and remainder of comprehensive ROS otherwise negative.  HOME MEDS: Current Outpatient Medications  Medication Sig Dispense Refill   aspirin 81 MG tablet Take 81 mg by mouth daily.     carvedilol (COREG) 12.5 MG tablet Take 1 tablet (12.5 mg total) by mouth 2 (two) times daily. 180 tablet 3   clopidogrel (PLAVIX) 75 MG tablet TAKE 1 TABLET BY MOUTH EVERY DAY 90 tablet 3   ezetimibe (ZETIA) 10 MG tablet Take 1 tablet (10 mg total) by mouth daily. 90 tablet 3   sacubitril-valsartan (ENTRESTO) 49-51 MG Take 1 tablet by mouth 2 (two) times daily. 60 tablet 11   sildenafil (REVATIO) 20 MG tablet TAKE 2 TO 3 TABLETS (40-60 MG TOTAL) BY MOUTH AS NEEDED FOR SEXUAL ACTIVITY 50 tablet 1   simvastatin (ZOCOR) 80 MG tablet TAKE 1 TABLET (80 MG TOTAL) BY MOUTH AT BEDTIME. 90 tablet 1   No current facility-administered medications for this visit.    Facility-Administered Medications Ordered in Other Visits  Medication Dose Route Frequency Provider Last Rate Last Dose   acetaminophen (TYLENOL) tablet 975 mg  975 mg Oral Q6H PRN Carolan Clines, MD        LABS/IMAGING: No results found for this or any previous visit (from the past 48 hour(s)). No results found.  VITALS: BP 130/74 (BP Location: Left Arm, Patient Position: Sitting, Cuff Size: Normal)    Pulse 68    Ht 6' (1.829 m)    Wt 173 lb (78.5 kg)    BMI 23.46 kg/m   EXAM: General appearance: alert and no distress Neck: no JVD, supple, symmetrical, trachea midline and thyroid not enlarged, symmetric, no tenderness/mass/nodules Lungs: clear to auscultation bilaterally Heart: regular rate and rhythm Abdomen: soft, non-tender; bowel sounds normal; no  masses,  no organomegaly Extremities: extremities normal, atraumatic, no cyanosis or edema Pulses: 2+ and symmetric Skin: Skin color, texture, turgor normal. No rashes or lesions Neurologic: Grossly normal Psych: Pleasant  EKG: Normal sinus rhythm at 68, inferior and anterior infarct patterns-personally reviewed  ASSESSMENT: 1. Ischemic cardiomyopathy, EF 35%, with inferolateral and anteroapical scar, NYHA Class II symptoms - confirmed by cMRI with improved LVEF to 41% (12/2018) 2. Hypertension 3. Hyperlipidemia 4. Continued tobacco abuse 5. ED 6. BPH 7. Probable COPD 8. Mild carotid artery disease 9. Right subclavian artery stenosis  PLAN: 1.   Tyrone Stanton continues to do well on his current heart failure regimen.  His LVEF is slightly higher by cardiac MRI to 41% but did show full-thickness scar in the inferolateral wall and distal anterior wall, apex and septum.  This may be the best that his LVEF is able to improve 2.  We will plan to continue his current medications.  At this point is not qualify for AICD for primary prevention of sudden cardiac death.  Finally, we discussed smoking cessation again at length today.  He is not quite ready to stop smoking however he said he did quit up to year in the past and feels like he could do it again.  Follow-up with me in 6 months or sooner as necessary.  Pixie Casino, MD, [facility], [place] Director of the Advanced Lipid Disorders &  Cardiovascular Risk Reduction Clinic Diplomate of the American Board of Clinical Lipidology Attending Cardiologist  Direct Dial: [phone]   Fax: [phone]  Website:  [email or web address] 02/02/2019, 8:34 AM

## 2019-02-02 NOTE — Patient Instructions (Signed)
Medication Instructions:  Your physician recommends that you continue on your current medications as directed. Please refer to the Current Medication list given to you today.  If you need a refill on your cardiac medications before your next appointment, please call your pharmacy.   Follow-Up: At [facility], you and your health needs are our priority.  As part of our continuing mission to provide you with exceptional heart care, we have created designated Provider Care Teams.  These Care Teams include your primary Cardiologist (physician) and Advanced Practice Providers (APPs -  Physician Assistants and Nurse Practitioners) who all work together to provide you with the care you need, when you need it. You will need a follow up appointment in 6 months.  Please call our office 2 months in advance to schedule this appointment.  You may see Kenneth C Hilty, MD or one of the following Advanced Practice Providers on your designated Care Team: Hao Meng, PA-C . Angela Duke, PA-C  Any Other Special Instructions Will Be Listed Below (If Applicable).    

## 2019-02-06 ENCOUNTER — Other Ambulatory Visit: Payer: Self-pay | Admitting: Internal Medicine

## 2019-02-19 ENCOUNTER — Other Ambulatory Visit: Payer: Self-pay | Admitting: Internal Medicine

## 2019-02-21 ENCOUNTER — Other Ambulatory Visit: Payer: Self-pay | Admitting: Internal Medicine

## 2019-04-25 ENCOUNTER — Other Ambulatory Visit: Payer: Self-pay | Admitting: Internal Medicine

## 2019-06-27 ENCOUNTER — Other Ambulatory Visit: Payer: Self-pay | Admitting: Internal Medicine

## 2019-07-10 ENCOUNTER — Encounter: Payer: Self-pay | Admitting: Internal Medicine

## 2019-07-10 ENCOUNTER — Other Ambulatory Visit: Payer: Self-pay

## 2019-07-10 ENCOUNTER — Ambulatory Visit: Payer: Managed Care, Other (non HMO) | Admitting: Internal Medicine

## 2019-07-10 VITALS — BP 122/70 | HR 68 | Temp 94.5°F | Ht 72.0 in | Wt 175.0 lb

## 2019-07-10 DIAGNOSIS — I6523 Occlusion and stenosis of bilateral carotid arteries: Secondary | ICD-10-CM

## 2019-07-10 DIAGNOSIS — I2510 Atherosclerotic heart disease of native coronary artery without angina pectoris: Secondary | ICD-10-CM

## 2019-07-10 DIAGNOSIS — I10 Essential (primary) hypertension: Secondary | ICD-10-CM

## 2019-07-10 DIAGNOSIS — E782 Mixed hyperlipidemia: Secondary | ICD-10-CM

## 2019-07-10 DIAGNOSIS — I2583 Coronary atherosclerosis due to lipid rich plaque: Secondary | ICD-10-CM

## 2019-07-10 DIAGNOSIS — I255 Ischemic cardiomyopathy: Secondary | ICD-10-CM

## 2019-07-10 DIAGNOSIS — Z716 Tobacco abuse counseling: Secondary | ICD-10-CM

## 2019-07-10 NOTE — Patient Instructions (Signed)
Medication Instructions:  Your physician recommends that you continue on your current medications as directed. Please refer to the Current Medication list given to you today.  *If you need a refill on your cardiac medications before your next appointment, please call your pharmacy*  Testing/Procedures: Echocardiogram to be completed in 1 year - before your next visit with Dr. Rennis Golden This will be scheduled at [address] - [address] Floor   Follow-Up: At [facility], you and your health needs are our priority.  As part of our continuing mission to provide you with exceptional heart care, we have created designated Provider Care Teams.  These Care Teams include your primary Cardiologist (physician) and Advanced Practice Providers (APPs -  Physician Assistants and Nurse Practitioners) who all work together to provide you with the care you need, when you need it.  We recommend signing up for the patient portal called "MyChart".  Sign up information is provided on this After Visit Summary.  MyChart is used to connect with patients for Virtual Visits (Telemedicine).  Patients are able to view lab/test results, encounter notes, upcoming appointments, etc.  Non-urgent messages can be sent to your provider as well.   To learn more about what you can do with MyChart, go to [email or web address].    Your next appointment:   12 month(s)  The format for your next appointment:   In Person  Provider:   You may see Chrystie Nose, MD or one of the following Advanced Practice Providers on your designated Care Team:    Azalee Course, PA-C  Micah Flesher, PA-C or   Judy Pimple, [place]    Other Instructions

## 2019-07-10 NOTE — Progress Notes (Signed)
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 He has a history of a large anteroapical infarct in the past with a low EF of less than 40%, actually 39% at his last stress test and New York Heart Association Class I-II symptoms. He continues to be generally asymptomatic and can do a high level of exercise. He is here today again for DOT physical and underwent exercise nuclear stress testing on December 12, 2010, with 8 minutes of exercise and 10 metabolic equivalents. The stress test demonstrated a moderate to severe infarct in the apical anterior, apical basal, inferoseptal, inferior, mid-inferoseptal, mid-inferior, and apical inferior regions, which is thought to be unchanged from his previous study. He describes no new cardiovascular symptoms. He is requesting another stress test per DOT guidelines.   Tyrone Stanton returns today for his annual visit. At his last office visit I recommended another stress test however it was declined by his insurance company. Probably because he is asymptomatic however it was requested per DOT guidelines which are not up-to-date. He continues to feel very well and does high level of work and exercise. He denies any chest pain or shortness of breath. We have not reassess his LV function and over 3 years and is due for repeat echocardiography. He is also not had a lipid assessment and over a year.  Tyrone Stanton returns today for follow-up. At his last office visit he had reassessment of LV function by echo which showed EF stable at 40%. Fortunately continues to smoke. 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 He continues to have problems with erectile dysfunction but seems to be well-controlled with Viagra. He is not yet  established a primary care provider. He is previously seen a urologist [name], but has not followed up.  11/22/2015  Tyrone Stanton returns today for follow-up. Over the past year he's had no new complaints. He continues to work long days and has no problems with chest pain or worsening shortness of breath. He is in need of a refill of his Revatio. He is not establish with a primary care provider. I asked him whether he never had screening colonoscopy and he has not yet. I will therefore refer him for that. 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 He's not had repeat imaging since that time. He continues to smoke about 1-1/2 packs per day. I reviewed current screening guidelines which indicate a recommendation for a CT scan of the chest and smokers that are in their 60s. I'm recommending him to get a CT scan to screen for early malignancies.  11/26/2016  Tyrone Stanton was seen today in follow-up. He reports feeling well. He says he's somewhat interested in stopping smoking however has not yet been able to do that. Recently had surgery to remove a cyst from his testicle which was benign. He is followed with [facility]. He denies any worsening chest pain. He does have shortness of breath but it's been stable. He's not had any lab work but recently had a DOT physical. Weight is down about 5 pounds and his BMI is normal. He is due for repeat lipid profile. He does not have a primary care provider.  12/27/2017  Tyrone Stanton was seen today in routine follow-up.  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  Because of this he says is not been able to stop smoking.  He is also not been able to cut back.  He denies any new chest pain or worsening shortness of breath.  We have discussed multiple times the fact that we need to continue to follow-up carotid Dopplers.  He has a right carotid bruit which showed  mild plaque in 2014 but has not been reassessed.  In addition his last echo was in 2016 which showed LVEF about 40% with anterior MI.  He denies any new weight gain, orthopnea, PND or other heart failure symptoms.  He reports compliance with his medications.  He has not had recent lab work that we could obtain.  04/15/2018  Tyrone Stanton returns today for follow-up of his studies.  He underwent a repeat echocardiogram, which unfortunately shows a decline in LVEF down to 35%.  There is evidence of fixed defect suggestive of prior MI.  His EF previously was 45 to 50%.  In addition he had bilateral carotid Dopplers which showed mild disease however there is significant stenosis of the right subclavian artery.  This was manifest by differential blood pressures of about 20 to 25 mmHg systolic between the right and left arm.  In the future is recommended that left arm blood pressures be obtained.  He currently denies any chest pain or worsening shortness of breath.  He has no more than NYHA class II symptoms.  05/24/2018  Tyrone Stanton is seen today in follow-up.  He seems to be tolerating the switch from lisinopril to [facility].  He is currently on the 49/51 mg dose.  Blood pressure is much better controlled now 110/72.  He is also on carvedilol 12.5 mg twice daily, aspirin, Plavix, Zetia and simvastatin.  Overall he feels well.  Denies any worsening shortness of breath or any significant side effects from the [place].  At this point however I do not believe we can titrate up the medication much more.  He says his blood pressure is even a little lower at home.  02/02/2019  Tyrone Stanton is seen today in follow-up.  He underwent cardiac MRI in August 2020, which demonstrated an LVEF of 41% with inferolateral akinesis, septal apical and distal anterior wall hypokinesis.  There was full-thickness scar in the inferolateral wall from the apex to base and partial thickness scar in the distal anterior wall, apex and septum.   Overall this is a relatively good finding, considering he has known prior infarct and that his LVEF is probably as optimized as it could be discounting the areas of infarcted myocardium.  He says he is been pleased with his blood pressure control on Entresto, for which she is on moderate dose.  Blood pressure is well controlled at 130/74.  Weight is normal.  EKG shows a sinus rhythm with inferior and anterior infarct pattern.  Unfortunately continues to smoke.  We discussed that today.  At this time he is not quite ready to quit but he says he did quit for up to a year in the past and thinks he could do it again.  He had tried Chantix but had vivid dreams with it and discontinued it.  07/10/2019  Tyrone Stanton returns for annual follow-up.  Overall he continues to feel well.  He endorses NYHA class I symptoms.  As mentioned previously his cardiac MRI showed  an EF of 41% with scar and that was full-thickness in the inferolateral wall from apex to base and partial thickness scar in the distal anterior wall, apex and septum.  Tyrone Stanton continues to smoke and reports due to stress he has not been able to stop.  Blood pressure is well controlled.   Past Medical History:  Diagnosis Date  . BPH with urinary obstruction   . Carotid bruit    asymptomatic ;Carotid doppler 2014 mild RICA plaque  . COPD with emphysema ([place])   . Coronary artery disease cardiologist-  dr Ellaina Schuler   hx large anteroapical MI  s/p  PCI with DES x3  to mCFX, pRCA, and OM1--- last Echo-45-50%,LV normal  . Dyslipidemia   . ED (erectile dysfunction)   . Epididymal cyst    left  . Full dentures   . History of adenomatous polyp of colon    01-28-2016  tubular adenoma's  . History of anteroapical myocardial infarction 12-29-2007    dr berry   pt entirely asymptomatic w/  positive Myoview--- per Cardiac Cath  3 vessel disease w/ mild to mod. LV dyfunction  . Hypertension   . Ischemic cardiomyopathy per echo 04-18-2015 -- ef 45%    12-12-2010  Exercise stress test-EF 37% mod. to severe perfusion defect to infarct \\scar  with mild perinfarct ischemia -apical anterior,apical basal inferioseptal, basal inferior, mid inferoseptal, mid inferior and apical inferior regions. the post stess LV  normal.;New York Heart Assoc. class l and II symptoms  . LAFB (left anterior fascicular block)   . S/P drug eluting coronary stent placement 01/11/2008   x3  to midCFX,  pRCA, and OM1  . Smokers' cough ([place])    intermittant productive  . Weak urinary stream   . Wears glasses     Past Surgical History:  Procedure Laterality Date  . CARDIAC CATHETERIZATION  12/29/2007   dr berry   LAD-long segmental 50 to 60% after the first mod. diagonal branch btwn ST1 and ST2; Lge OM2 80% proximal; RCA,RV branch 80 to 90% mid; RCA gave off PDA and PLA  appeared  to be 90% at the origin   . CARDIOVASCULAR STRESS TEST  12/12/2010  dr berry   Abnormal Low Risk nuclear study (compared to previous study, no signigicant change) moderate to severe perfusion defect , scar/infarct, w/ mild perinfarct ischemia in the apical anterior, apical, basal inferoseptal , basal inferor, mid inferoseptal, mid inferor and apical inferior regions/ post-stress EF 37% w/ dyskinesis in the apical regions & moderate hypokinesi basal & mid inferior regions  . COLONOSCOPY  01/28/2016  . CORONARY ANGIOPLASTY WITH STENT PLACEMENT  09/092009    dr berry   Cutting balloon atherectomy Mid Circ 2.5x12 Promus; Ostium of the 1st marginal branch angoiplasty result 20-30%;RCA stented 2.5 x 15 Promus post dilated w/ 2.75x12 [place] reduce80-90% proximal dominanat RCAto 0% (total DES x3)  . EPIDIDYMECTOMY Left 06/05/2016   Procedure: LEFT SCROTAL EXPLORATION WITH MULTIPLE REMOVALS OF EPIDIDYMAL CYSTS;  Surgeon: Carolan Clines, MD;  Location: [facility];  Service: Urology;  Laterality: Left;  . LUMBAR LAMINECTOMY  1988  . TRANSTHORACIC ECHOCARDIOGRAM  04/18/2015   very mild  LVH,  ef 45%, akinesis of the mid anterior, mid anteroseptal , and apical septal ; severe hypokinesis of the apical inferior'; moderate hypokinesis of the apical anterior;  mild hypokinesis of the mid anterolateral, apical lateral, and apical;  grade 2 diastolic dysfunction/  mild to moderate AV calcification without stenosis/  mild MR  and TR/  mild reduced RVSF/      FAMHx:  Family History  Problem Relation Age of Onset  . Stroke Mother   . Cancer Mother   . Heart disease Father     SOCHx:   reports that he has been smoking cigarettes. He has a 94.00 pack-year smoking history. He has never used smokeless tobacco. He reports that he does not drink alcohol or use drugs.  ALLERGIES:  No Known Allergies  ROS: Pertinent items noted in HPI and remainder of comprehensive ROS otherwise negative.  HOME MEDS: Current Outpatient Medications  Medication Sig Dispense Refill  . aspirin 81 MG tablet Take 81 mg by mouth daily.    . carvedilol (COREG) 12.5 MG tablet TAKE 1 TABLET (12.5 MG TOTAL) BY MOUTH 2 (TWO) TIMES DAILY. 180 tablet 3  . clopidogrel (PLAVIX) 75 MG tablet TAKE 1 TABLET BY MOUTH EVERY DAY 90 tablet 3  . ENTRESTO 49-51 MG TAKE 1 TABLET BY MOUTH TWICE A DAY 180 tablet 3  . ezetimibe (ZETIA) 10 MG tablet TAKE 1 TABLET BY MOUTH EVERY DAY 90 tablet 3  . sildenafil (REVATIO) 20 MG tablet TAKE 2 TO 3 TABLETS BY MOUTH AS NEEDED FOR SEXUAL ACTIVITY 50 tablet 1  . simvastatin (ZOCOR) 80 MG tablet TAKE 1 TABLET (80 MG TOTAL) BY MOUTH AT BEDTIME. 90 tablet 0   No current facility-administered medications for this visit.   Facility-Administered Medications Ordered in Other Visits  Medication Dose Route Frequency Provider Last Rate Last Admin  . acetaminophen (TYLENOL) tablet 975 mg  975 mg Oral Q6H PRN Jethro Bolusannenbaum, Sigmund, MD        LABS/IMAGING: No results found for this or any previous visit (from the past 48 hour(s)). No results found.  VITALS: BP 122/70 (BP Location: Left Arm,  Patient Position: Sitting, Cuff Size: Normal)   Pulse 68   Temp (!) 94.5 F (34.7 C)   Ht 6' (1.829 m)   Wt 175 lb (79.4 kg)   BMI 23.73 kg/m   EXAM: General appearance: alert and no distress Neck: no JVD, supple, symmetrical, trachea midline and thyroid not enlarged, symmetric, no tenderness/mass/nodules Lungs: clear to auscultation bilaterally Heart: regular rate and rhythm Abdomen: soft, non-tender; bowel sounds normal; no masses,  no organomegaly Extremities: extremities normal, atraumatic, no cyanosis or edema Pulses: 2+ and symmetric Skin: Skin color, texture, turgor normal. No rashes or lesions Neurologic: Grossly normal Psych: Pleasant  EKG: Normal sinus rhythm at 68, inferior and anterior infarct-personally reviewed  ASSESSMENT: 1. Ischemic cardiomyopathy, EF 35%, with inferolateral and anteroapical scar, NYHA Class I-II symptoms - confirmed by cMRI with improved LVEF to 41% (12/2018) 2. Hypertension 3. Hyperlipidemia 4. Continued tobacco abuse 5. ED 6. BPH 7. Probable COPD 8. Mild carotid artery disease 9. Right subclavian artery stenosis  PLAN: 1.   Tyrone Stanton is doing well and is asymptomatic with his ischemic cardiomyopathy.  He endorses NYHA class I-II symptoms.  LVEF by MRI was 41% last year.  He has no new symptoms.  Unfortunately continues to smoke.  Blood pressure and cholesterol been well controlled.  I will plan a repeat echo in a year with no changes to his medicines today.  Follow-up with me annually or as necessary.  Chrystie NoseKenneth C. Miyuki Rzasa, MD, Methodist Texsan HospitalFACC, FACP  [place]  [facility]  Medical Director of the Advanced Lipid Disorders &  Cardiovascular Risk Reduction Clinic Diplomate of the American Board of Clinical Lipidology Attending Cardiologist  Direct Dial: [phone]  Fax: [phone]  Website:  [email or web address]   Lisette Abu Gicela Schwarting 07/10/2019, 8:20 AM

## 2019-08-19 ENCOUNTER — Other Ambulatory Visit: Payer: Self-pay | Admitting: Internal Medicine

## 2019-08-22 ENCOUNTER — Ambulatory Visit (HOSPITAL_COMMUNITY): Payer: Managed Care, Other (non HMO) | Attending: Cardiovascular Disease

## 2019-08-22 ENCOUNTER — Other Ambulatory Visit: Payer: Self-pay

## 2019-08-22 DIAGNOSIS — I255 Ischemic cardiomyopathy: Secondary | ICD-10-CM

## 2019-11-01 ENCOUNTER — Other Ambulatory Visit: Payer: Self-pay | Admitting: Internal Medicine

## 2020-01-03 ENCOUNTER — Other Ambulatory Visit: Payer: Self-pay | Admitting: Internal Medicine

## 2020-02-01 ENCOUNTER — Other Ambulatory Visit: Payer: Self-pay | Admitting: Internal Medicine

## 2020-02-10 ENCOUNTER — Other Ambulatory Visit: Payer: Self-pay | Admitting: Internal Medicine

## 2020-04-04 ENCOUNTER — Other Ambulatory Visit: Payer: Self-pay | Admitting: Internal Medicine

## 2020-05-22 ENCOUNTER — Emergency Department (HOSPITAL_COMMUNITY)
Admission: EM | Admit: 2020-05-22 | Discharge: 2020-05-23 | Disposition: A | Discharge status: Home / Self Care | Payer: BC Managed Care – PPO | Attending: Emergency Medicine | Admitting: Emergency Medicine

## 2020-05-22 ENCOUNTER — Other Ambulatory Visit: Payer: Self-pay

## 2020-05-22 ENCOUNTER — Telehealth: Payer: Self-pay | Admitting: Internal Medicine

## 2020-05-22 ENCOUNTER — Encounter (HOSPITAL_COMMUNITY): Payer: Self-pay

## 2020-05-22 DIAGNOSIS — M542 Cervicalgia: Secondary | ICD-10-CM | POA: Insufficient documentation

## 2020-05-22 DIAGNOSIS — Z5321 Procedure and treatment not carried out due to patient leaving prior to being seen by health care provider: Secondary | ICD-10-CM | POA: Diagnosis not present

## 2020-05-22 DIAGNOSIS — R200 Anesthesia of skin: Secondary | ICD-10-CM | POA: Diagnosis not present

## 2020-05-22 NOTE — ED Triage Notes (Signed)
Patient coming from home, reports L sided neck pain with numbness and tingling into his L shoulder, states it has been going on for about 3 days, denies chest pain.

## 2020-05-22 NOTE — Telephone Encounter (Signed)
    Pt said he started feeling a tingling sensation on his Left side of his neck down to his left shoulder. He would like to ask Dr. Rennis Golden if he can get ultrasound of his neck to check if he have some blockage

## 2020-05-22 NOTE — Telephone Encounter (Signed)
Called patient. Patient having active numbness and tingling in left neck and shoulder. Unable to obtain blood pressure or pulse as machine is not working. Patient does not have dizziness, weakness or blurred vision. No chest pain or shortness of breath. Patient made follow up appointment for Friday with Azalee Course, PA but advised to go to ER for evaluation due to active symptoms and inability to further assess via telephone. Patient verbalized understanding. Call made to Trish with [facility].

## 2020-05-22 NOTE — Telephone Encounter (Signed)
Thanks for talking with him - seems like appropriate advice.  Dr Rexene Edison

## 2020-05-23 NOTE — ED Notes (Signed)
Pt left due to not being seen quick enough 

## 2020-05-23 NOTE — Telephone Encounter (Signed)
Patient LWBS d/t ED wait time Has appointment tomorrow 05/24/20

## 2020-05-24 ENCOUNTER — Other Ambulatory Visit: Payer: Self-pay

## 2020-05-24 ENCOUNTER — Encounter: Payer: Self-pay | Admitting: Physician Assistant

## 2020-05-24 ENCOUNTER — Ambulatory Visit: Payer: BC Managed Care – PPO | Admitting: Physician Assistant

## 2020-05-24 VITALS — BP 118/68 | HR 62 | Temp 95.9°F | Ht 72.0 in | Wt 178.0 lb

## 2020-05-24 DIAGNOSIS — I10 Essential (primary) hypertension: Secondary | ICD-10-CM | POA: Diagnosis not present

## 2020-05-24 DIAGNOSIS — I739 Peripheral vascular disease, unspecified: Secondary | ICD-10-CM | POA: Diagnosis not present

## 2020-05-24 DIAGNOSIS — I255 Ischemic cardiomyopathy: Secondary | ICD-10-CM

## 2020-05-24 DIAGNOSIS — E785 Hyperlipidemia, unspecified: Secondary | ICD-10-CM

## 2020-05-24 DIAGNOSIS — I2510 Atherosclerotic heart disease of native coronary artery without angina pectoris: Secondary | ICD-10-CM | POA: Diagnosis not present

## 2020-05-24 MED ORDER — EZETIMIBE 10 MG PO TABS: 10.0000 mg | ORAL_TABLET | Freq: Every day | ORAL | 3 refills | Status: DC

## 2020-05-24 NOTE — Patient Instructions (Addendum)
Medication Instructions:  Your physician recommends that you continue on your current medications as directed. Please refer to the Current Medication list given to you today.  *If you need a refill on your cardiac medications before your next appointment, please call your pharmacy*  Lab Work: NONE ordered at this time of appointment   If you have labs (blood work) drawn today and your tests are completely normal, you will receive your results only by: Marland Kitchen MyChart Message (if you have MyChart) OR . A paper copy in the mail If you have any lab test that is abnormal or we need to change your treatment, we will call you to review the results.  Testing/Procedures: Your physician has requested that you have a carotid duplex. This test is an ultrasound of the carotid arteries in your neck. It looks at blood flow through these arteries that supply the brain with blood. Allow one hour for this exam. There are no restrictions or special instructions.  Follow-Up: At [facility], you and your health needs are our priority.  As part of our continuing mission to provide you with exceptional heart care, we have created designated Provider Care Teams.  These Care Teams include your primary Cardiologist (physician) and Advanced Practice Providers (APPs -  Physician Assistants and Nurse Practitioners) who all work together to provide you with the care you need, when you need it.  We recommend signing up for the patient portal called "MyChart".  Sign up information is provided on this After Visit Summary.  MyChart is used to connect with patients for Virtual Visits (Telemedicine).  Patients are able to view lab/test results, encounter notes, upcoming appointments, etc.  Non-urgent messages can be sent to your provider as well.   To learn more about what you can do with MyChart, go to [email or web address].    Your next appointment:   Follow up as scheduled-07/22/20 at 8:00 AM    The format for your next  appointment:   In Person  Provider:   K. Italy Hilty, MD   Other Instructions

## 2020-05-24 NOTE — Progress Notes (Signed)
Cardiology Office Note:    Date:  05/26/2020   ID:  Tyrone Stanton, DOB 1956-04-11, MRN [id number]  PCP:  Patient, No Pcp Per  [facility] Cardiologist:  Chrystie Nose, MD  [facility] Electrophysiologist:  None   Referring MD: No ref. provider found   Chief Complaint  Patient presents with  . Numbness    Left side of neck and arm Numbness and tingling for 4 days     History of Present Illness:    Tyrone Stanton is a 65 y.o. male with a hx of CAD, COPD, HTN, HLD, right subclavian artery stenosis, tobacco abuse and history of ischemic cardiomyopathy.  He has a history of a large anteroapical infarct in the past with EF less than 40%.  Carotid Doppler obtained in December 2019 showed mild 1 to 39% bilateral ICA disease, significant right subclavian artery stenosis with normal flow in the left subclavian artery.  Therefore it is recommended to obtain blood pressure in the left arm only.  Cardiac MRI in August 2020 demonstrated EF 41%, inferolateral akinesis, septal apical and distal anterior hypokinesis, full-thickness scar in the inferolateral wall from apex to the base and partial thickness scar in the distal anterior wall, apex and septum.  He was last seen by Dr. Rennis Golden March 2021, at which time he continued to smoke and has not been able to stop at all.  Repeat echocardiogram obtained on 08/22/2019 showed EF 40 to 45%, RVSP 32.8 mmHg, mild MR.  EF has noticeably improved when compared to the previous echo in December 2019 with EF of 35%.  Patient presents today for evaluation of numbness and tingling sensation in the base of the left neck to the left shoulder area. Symptoms started in the past 3 days. This typically occurs at night when he is not exerting himself. The numbness last roughly 1 minute before going away however recurs every few minutes to every few hours. Symptom is not exacerbated by exertion. Overall, his symptom is atypical for anginal symptom. He has a history of right  subclavian artery stenosis, however the current symptom is on the left side. He has been noticing some left arm weakness, suspicion for significant left subclavian artery stenosis is fairly low, however I think will be reasonable to evaluate with carotid ultrasound. Otherwise, I suspect the symptom may be related to pinched nerve or arthritis. He can follow-up with Dr. Rennis Golden.   Past Medical History:  Diagnosis Date  . BPH with urinary obstruction   . Carotid bruit    asymptomatic ;Carotid doppler 2014 mild RICA plaque  . COPD with emphysema (HCC)   . Coronary artery disease cardiologist-  dr hilty   hx large anteroapical MI  s/p  PCI with DES x3  to mCFX, pRCA, and OM1--- last Echo-45-50%,LV normal  . Dyslipidemia   . ED (erectile dysfunction)   . Epididymal cyst    left  . Full dentures   . History of adenomatous polyp of colon    01-28-2016  tubular adenoma's  . History of anteroapical myocardial infarction 12-29-2007    dr berry   pt entirely asymptomatic w/  positive Myoview--- per Cardiac Cath  3 vessel disease w/ mild to mod. LV dyfunction  . Hypertension   . Ischemic cardiomyopathy per echo 04-18-2015 -- ef 45%   12-12-2010  Exercise stress test-EF 37% mod. to severe perfusion defect to infarct \\scar  with mild perinfarct ischemia -apical anterior,apical basal inferioseptal, basal inferior, mid inferoseptal, mid inferior and apical inferior regions.  the post stess LV  normal.;[employer] class l and II symptoms  . LAFB (left anterior fascicular block)   . S/P drug eluting coronary stent placement 01/11/2008   x3  to midCFX,  pRCA, and OM1  . Smokers' cough (HCC)    intermittant productive  . Weak urinary stream   . Wears glasses     Past Surgical History:  Procedure Laterality Date  . CARDIAC CATHETERIZATION  12/29/2007   dr berry   LAD-long segmental 50 to 60% after the first mod. diagonal branch btwn ST1 and ST2; Lge OM2 80% proximal; RCA,RV branch 80 to 90% mid;  RCA gave off PDA and PLA  appeared  to be 90% at the origin   . CARDIOVASCULAR STRESS TEST  12/12/2010  dr berry   Abnormal Low Risk nuclear study (compared to previous study, no signigicant change) moderate to severe perfusion defect , scar/infarct, w/ mild perinfarct ischemia in the apical anterior, apical, basal inferoseptal , basal inferor, mid inferoseptal, mid inferor and apical inferior regions/ post-stress EF 37% w/ dyskinesis in the apical regions & moderate hypokinesi basal & mid inferior regions  . COLONOSCOPY  01/28/2016  . CORONARY ANGIOPLASTY WITH STENT PLACEMENT  09/092009    dr berry   Cutting balloon atherectomy Mid Circ 2.5x12 Promus; Ostium of the 1st marginal branch angoiplasty result 20-30%;RCA stented 2.5 x 15 Promus post dilated w/ 2.75x12 [place] reduce80-90% proximal dominanat RCAto 0% (total DES x3)  . EPIDIDYMECTOMY Left 06/05/2016   Procedure: LEFT SCROTAL EXPLORATION WITH MULTIPLE REMOVALS OF EPIDIDYMAL CYSTS;  Surgeon: Jethro BolusSigmund Tannenbaum, MD;  [facility];  Service: Urology;  Laterality: Left;  . LUMBAR LAMINECTOMY  1988  . TRANSTHORACIC ECHOCARDIOGRAM  04/18/2015   very mild LVH,  ef 45%, akinesis of the mid anterior, mid anteroseptal , and apical septal ; severe hypokinesis of the apical inferior'; moderate hypokinesis of the apical anterior;  mild hypokinesis of the mid anterolateral, apical lateral, and apical;  grade 2 diastolic dysfunction/  mild to moderate AV calcification without stenosis/  mild MR and TR/  mild reduced RVSF/      Current Medications: Current Meds  Medication Sig  . aspirin 81 MG tablet Take 81 mg by mouth daily.  . carvedilol (COREG) 12.5 MG tablet TAKE 1 TABLET (12.5 MG TOTAL) BY MOUTH 2 (TWO) TIMES DAILY.  Marland Kitchen. clopidogrel (PLAVIX) 75 MG tablet TAKE 1 TABLET BY MOUTH EVERY DAY  . ENTRESTO 49-51 MG TAKE 1 TABLET BY MOUTH TWICE A DAY  . sildenafil (REVATIO) 20 MG tablet TAKE 2 TO 3 TABLETS BY MOUTH AS NEEDED FOR  SEXUAL ACTIVITY  . simvastatin (ZOCOR) 80 MG tablet TAKE 1 TABLET (80 MG TOTAL) BY MOUTH AT BEDTIME.  . [DISCONTINUED] ezetimibe (ZETIA) 10 MG tablet TAKE 1 TABLET BY MOUTH EVERY DAY     Allergies:   Patient has no known allergies.   Social History   Socioeconomic History  . Marital status: Widowed    Spouse name: Not on file  . Number of children: 0  . Years of education: Not on file  . Highest education level: Not on file  Occupational History    Employer: [employer]  Tobacco Use  . Smoking status: Current Every Day Smoker    Packs/day: 2.00    Years: 47.00    Pack years: 94.00    Types: Cigarettes  . Smokeless tobacco: Never Used  Substance and Sexual Activity  . Alcohol use: No  . Drug  use: No  . Sexual activity: Not on file  Other Topics Concern  . Not on file  Social History Narrative  . Not on file   Social Determinants of Health   Financial Resource Strain: Not on file  Food Insecurity: Not on file  Transportation Needs: Not on file  Physical Activity: Not on file  Stress: Not on file  Social Connections: Not on file     Family History: The patient's family history includes Cancer in his mother; Heart disease in his father; Stroke in his mother.  ROS:   Please see the history of present illness.     All other systems reviewed and are negative.  EKGs/Labs/Other Studies Reviewed:    The following studies were reviewed today:  Echo 08/22/2019 1. Left ventricular ejection fraction, by estimation, is 40 to 45%. The  left ventricle has mildly decreased function. The left ventricle  demonstrates regional wall motion abnormalities (see scoring  diagram/findings for description). Left ventricular  diastolic parameters are consistent with Grade I diastolic dysfunction  (impaired relaxation).  2. Right ventricular systolic function is normal. The right ventricular  size is normal. There is normal pulmonary artery systolic pressure. The  estimated  right ventricular systolic pressure is 32.8 mmHg.  3. The mitral valve is grossly normal. Mild mitral valve regurgitation.  No evidence of mitral stenosis.  4. The aortic valve is tricuspid. Aortic valve regurgitation is not  visualized. No aortic stenosis is present.  5. The inferior vena cava is normal in size with greater than 50%  respiratory variability, suggesting right atrial pressure of 3 mmHg.   Comparison(s): Changes from prior study are noted. EF improved to 40-45%. WMA consistent with prior LAD infarction.   EKG:  EKG is ordered today.  The ekg ordered today demonstrates normal sinus rhythm, poor with progression in the anterior leads.  Q waves in the inferior lead.  Recent Labs: No results found for requested labs within last 8760 hours.  Recent Lipid Panel    Component Value Date/Time   CHOL 156 12/27/2017 0852   TRIG 109 12/27/2017 0852   HDL 33 (L) 12/27/2017 0852   CHOLHDL 4.7 12/27/2017 0852   CHOLHDL 3.9 01/23/2015 1002   VLDL 20 01/23/2015 1002   LDLCALC 101 (H) 12/27/2017 0852     Risk Assessment/Calculations:       Physical Exam:    VS:  BP 118/68 (BP Location: Left Arm, Patient Position: Sitting, Cuff Size: Normal)   Pulse 62   Temp (!) 95.9 F (35.5 C) Comment: Forehead  Ht 6' (1.829 m)   Wt 178 lb (80.7 kg)   BMI 24.14 kg/m     Wt Readings from Last 3 Encounters:  05/24/20 178 lb (80.7 kg)  05/22/20 180 lb (81.6 kg)  07/10/19 175 lb (79.4 kg)     GEN:  Well nourished, well developed in no acute distress HEENT: Normal NECK: No JVD; No carotid bruits LYMPHATICS: No lymphadenopathy CARDIAC: RRR, no murmurs, rubs, gallops RESPIRATORY:  Clear to auscultation without rales, wheezing or rhonchi  ABDOMEN: Soft, non-tender, non-distended MUSCULOSKELETAL:  No edema; No deformity  SKIN: Warm and dry NEUROLOGIC:  Alert and oriented x 3 PSYCHIATRIC:  Normal affect   ASSESSMENT:    1. Subclavian artery disease (HCC)   2. Coronary artery  disease involving native coronary artery of native heart without angina pectoris   3. Essential hypertension   4. Hyperlipidemia LDL goal <70   5. Cardiomyopathy, ischemic    PLAN:  In order of problems listed above:  1. Subclavian artery stenosis: Patient has known history of right subclavian artery stenosis, the main concern he has during today's visit is actually numbness extending from the base of the left neck toward the left shoulder around the supraclavicular space.  Symptom does not have clear correlation with exertion.  Arm rotation does not trigger the symptom either.  Since he has a history of right subclavian artery stenosis, I will obtain a carotid Doppler with subclavian artery ultrasound as well.  Suspicion that his symptom is related to subclavian artery disease is fairly low.  2. CAD: His symptom is very atypical for any cardiac issue.  I suspect it is likely a pinched nerve in the neck area.  Continue aspirin and Plavix.  He is not on sublingual nitroglycerin due to the need for sildenafil.  3. Hypertension: Blood pressure well controlled on current therapy  4. Hyperlipidemia: Continue Zocor and Zetia  5. Ischemic cardiomyopathy: On Entresto and carvedilol.        Medication Adjustments/Labs and Tests Ordered: Current medicines are reviewed at length with the patient today.  Concerns regarding medicines are outlined above.  Orders Placed This Encounter  Procedures  . EKG 12-Lead  . VAS US CAROTID   No orders of the defined types were placed in this encounter.   Patient Instructions  Medication Instructions:  Your physician recommends that you continue on your current medications as directed. Please refer to the Current Medication list given to you today.  *If you need a refill on your cardiac medications before your next appointment, please call your pharmacy*  Lab Work: NONE ordered at this time of appointment   If you have labs (blood work) drawn today  and your tests are completely normal, you will receive your results only by: Marland Kitchen MyChart Message (if you have MyChart) OR . A paper copy in the mail If you have any lab test that is abnormal or we need to change your treatment, we will call you to review the results.  Testing/Procedures: Your physician has requested that you have a carotid duplex. This test is an ultrasound of the carotid arteries in your neck. It looks at blood flow through these arteries that supply the brain with blood. Allow one hour for this exam. There are no restrictions or special instructions.  Follow-Up: At [facility], you and your health needs are our priority.  As part of our continuing mission to provide you with exceptional heart care, we have created designated Provider Care Teams.  These Care Teams include your primary Cardiologist (physician) and Advanced Practice Providers (APPs -  Physician Assistants and Nurse Practitioners) who all work together to provide you with the care you need, when you need it.  We recommend signing up for the patient portal called "MyChart".  Sign up information is provided on this After Visit Summary.  MyChart is used to connect with patients for Virtual Visits (Telemedicine).  Patients are able to view lab/test results, encounter notes, upcoming appointments, etc.  Non-urgent messages can be sent to your provider as well.   To learn more about what you can do with MyChart, go to [email or web address].    Your next appointment:   Follow up as scheduled-07/22/20 at 8:00 AM    The format for your next appointment:   In Person  Provider:   K. Italy Hilty, MD   Other Instructions     Signed, Azalee Course, PA  05/26/2020 11:41 PM  [facility]

## 2020-05-26 ENCOUNTER — Encounter: Payer: Self-pay | Admitting: Physician Assistant

## 2020-06-06 ENCOUNTER — Ambulatory Visit (HOSPITAL_COMMUNITY)
Admission: RE | Admit: 2020-06-06 | Discharge: 2020-06-06 | Disposition: A | Discharge status: Home / Self Care | Payer: BC Managed Care – PPO | Source: Ambulatory Visit | Attending: Cardiovascular Disease | Admitting: Cardiovascular Disease

## 2020-06-06 ENCOUNTER — Other Ambulatory Visit: Payer: Self-pay

## 2020-06-06 DIAGNOSIS — I739 Peripheral vascular disease, unspecified: Secondary | ICD-10-CM | POA: Diagnosis not present

## 2020-06-06 DIAGNOSIS — I6523 Occlusion and stenosis of bilateral carotid arteries: Secondary | ICD-10-CM

## 2020-07-22 ENCOUNTER — Other Ambulatory Visit: Payer: Self-pay

## 2020-07-22 ENCOUNTER — Encounter: Payer: Self-pay | Admitting: Internal Medicine

## 2020-07-22 ENCOUNTER — Ambulatory Visit: Payer: BC Managed Care – PPO | Admitting: Internal Medicine

## 2020-07-22 VITALS — BP 128/76 | HR 68 | Ht 72.0 in | Wt 179.4 lb

## 2020-07-22 DIAGNOSIS — Z716 Tobacco abuse counseling: Secondary | ICD-10-CM

## 2020-07-22 DIAGNOSIS — I2510 Atherosclerotic heart disease of native coronary artery without angina pectoris: Secondary | ICD-10-CM

## 2020-07-22 DIAGNOSIS — I10 Essential (primary) hypertension: Secondary | ICD-10-CM

## 2020-07-22 DIAGNOSIS — E785 Hyperlipidemia, unspecified: Secondary | ICD-10-CM

## 2020-07-22 MED ORDER — SILDENAFIL CITRATE 20 MG PO TABS: ORAL_TABLET | ORAL | 1 refills | Status: DC

## 2020-07-22 NOTE — Progress Notes (Signed)
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 He has a history of a large anteroapical infarct in the past with a low EF of less than 40%, actually 39% at his last stress test and New [place] Heart Association Class I-II symptoms. He continues to be generally asymptomatic and can do a high level of exercise. He is here today again for DOT physical and underwent exercise nuclear stress testing on December 12, 2010, with 8 minutes of exercise and 10 metabolic equivalents. The stress test demonstrated a moderate to severe infarct in the apical anterior, apical basal, inferoseptal, inferior, mid-inferoseptal, mid-inferior, and apical inferior regions, which is thought to be unchanged from his previous study. He describes no new cardiovascular symptoms. He is requesting another stress test per DOT guidelines.   Tyrone Stanton returns today for his annual visit. At his last office visit I recommended another stress test however it was declined by his insurance company. Probably because he is asymptomatic however it was requested per DOT guidelines which are not up-to-date. He continues to feel very well and does high level of work and exercise. He denies any chest pain or shortness of breath. We have not reassess his LV function and over 3 years and is due for repeat echocardiography. He is also not had a lipid assessment and over a year.  Tyrone Stanton returns today for follow-up. At his last office visit he had reassessment of LV function by echo which showed EF stable at 40%. Fortunately continues to smoke. 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 He continues to have problems with erectile dysfunction but seems to be well-controlled with Viagra. He is not  yet established a primary care provider. He is previously seen a urologist [name], but has not followed up.  11/22/2015  Tyrone Stanton returns today for follow-up. Over the past year he's had no new complaints. He continues to work long days and has no problems with chest pain or worsening shortness of breath. He is in need of a refill of his Revatio. He is not establish with a primary care provider. I asked him whether he never had screening colonoscopy and he has not yet. I will therefore refer him for that. 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 He's not had repeat imaging since that time. He continues to smoke about 1-1/2 packs per day. I reviewed current screening guidelines which indicate a recommendation for a CT scan of the chest and smokers that are in their 60s. I'm recommending him to get a CT scan to screen for early malignancies.  11/26/2016  Tyrone Stanton was seen today in follow-up. He reports feeling well. He says he's somewhat interested in stopping smoking however has not yet been able to do that. Recently had surgery to remove a cyst from his testicle which was benign. He is followed with [facility]. He denies any worsening chest pain. He does have shortness of breath but it's been stable. He's not had any lab work but recently had a DOT physical. Weight is down about 5 pounds and his BMI is normal. He is due for repeat lipid profile. He does not have a primary care provider.  12/27/2017  Tyrone Stanton was seen today in routine follow-up.  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  Because of this he says is not been able to stop smoking.  He is also not been able to cut back.  He denies any new chest pain or worsening shortness of breath.  We have discussed multiple times the fact that we need to continue to follow-up carotid Dopplers.  He has a right carotid bruit which  showed mild plaque in 2014 but has not been reassessed.  In addition his last echo was in 2016 which showed LVEF about 40% with anterior MI.  He denies any new weight gain, orthopnea, PND or other heart failure symptoms.  He reports compliance with his medications.  He has not had recent lab work that we could obtain.  04/15/2018  Tyrone Stanton returns today for follow-up of his studies.  He underwent a repeat echocardiogram, which unfortunately shows a decline in LVEF down to 35%.  There is evidence of fixed defect suggestive of prior MI.  His EF previously was 45 to 50%.  In addition he had bilateral carotid Dopplers which showed mild disease however there is significant stenosis of the right subclavian artery.  This was manifest by differential blood pressures of about 20 to 25 mmHg systolic between the right and left arm.  In the future is recommended that left arm blood pressures be obtained.  He currently denies any chest pain or worsening shortness of breath.  He has no more than NYHA class II symptoms.  05/24/2018  Tyrone Stanton is seen today in follow-up.  He seems to be tolerating the switch from lisinopril to [facility].  He is currently on the 49/51 mg dose.  Blood pressure is much better controlled now 110/72.  He is also on carvedilol 12.5 mg twice daily, aspirin, Plavix, Zetia and simvastatin.  Overall he feels well.  Denies any worsening shortness of breath or any significant side effects from the RaysalEntresto.  At this point however I do not believe we can titrate up the medication much more.  He says his blood pressure is even a little lower at home.  02/02/2019  Tyrone Stanton is seen today in follow-up.  He underwent cardiac MRI in August 2020, which demonstrated an LVEF of 41% with inferolateral akinesis, septal apical and distal anterior wall hypokinesis.  There was full-thickness scar in the inferolateral wall from the apex to base and partial thickness scar in the distal anterior wall, apex and  septum.  Overall this is a relatively good finding, considering he has known prior infarct and that his LVEF is probably as optimized as it could be discounting the areas of infarcted myocardium.  He says he is been pleased with his blood pressure control on Entresto, for which she is on moderate dose.  Blood pressure is well controlled at 130/74.  Weight is normal.  EKG shows a sinus rhythm with inferior and anterior infarct pattern.  Unfortunately continues to smoke.  We discussed that today.  At this time he is not quite ready to quit but he says he did quit for up to a year in the past and thinks he could do it again.  He had tried Chantix but had vivid dreams with it and discontinued it.  07/10/2019  Tyrone Stanton returns for annual follow-up.  Overall he continues to feel well.  He endorses NYHA class I symptoms.  As mentioned previously his cardiac  MRI showed an EF of 41% with scar and that was full-thickness in the inferolateral wall from apex to base and partial thickness scar in the distal anterior wall, apex and septum.  Tyrone Stanton continues to smoke and reports due to stress he has not been able to stop.  Blood pressure is well controlled.   07/22/2020  Tyrone Stanton is seen today for routine follow-up.  He still does not have a primary care provider.  He does get some care through a urologist.  He says he feels well and endorses NYHA class I symptoms.  His blood pressure he says has been much better controlled after changing his medications.  Unfortunately continues to smoke.  Despite having had Chantix in the past he was not able to tolerate it.  His last LVEF was around 40% with scar that was full-thickness in the inferolateral wall and distal anterior wall.  He has not had any blood work it appears since 2019.  He likely has some COPD and emphysema but denies any productive cough, worsening sputum or worsening shortness of breath.  He is not currently on any inhalers.  Past Medical History:   Diagnosis Date  . BPH with urinary obstruction   . Carotid bruit    asymptomatic ;Carotid doppler 2014 mild RICA plaque  . COPD with emphysema (HCC)   . Coronary artery disease cardiologist-  dr Verdis Bassette   hx large anteroapical MI  s/p  PCI with DES x3  to mCFX, pRCA, and OM1--- last Echo-45-50%,LV normal  . Dyslipidemia   . ED (erectile dysfunction)   . Epididymal cyst    left  . Full dentures   . History of adenomatous polyp of colon    01-28-2016  tubular adenoma's  . History of anteroapical myocardial infarction 12-29-2007    dr berry   pt entirely asymptomatic w/  positive Myoview--- per Cardiac Cath  3 vessel disease w/ mild to mod. LV dyfunction  . Hypertension   . Ischemic cardiomyopathy per echo 04-18-2015 -- ef 45%   12-12-2010  Exercise stress test-EF 37% mod. to severe perfusion defect to infarct \\scar  with mild perinfarct ischemia -apical anterior,apical basal inferioseptal, basal inferior, mid inferoseptal, mid inferior and apical inferior regions. the post stess LV  normal.;New York Heart Assoc. class l and II symptoms  . LAFB (left anterior fascicular block)   . S/P drug eluting coronary stent placement 01/11/2008   x3  to midCFX,  pRCA, and OM1  . Smokers' cough (HCC)    intermittant productive  . Weak urinary stream   . Wears glasses     Past Surgical History:  Procedure Laterality Date  . CARDIAC CATHETERIZATION  12/29/2007   dr berry   LAD-long segmental 50 to 60% after the first mod. diagonal branch btwn ST1 and ST2; Lge OM2 80% proximal; RCA,RV branch 80 to 90% mid; RCA gave off PDA and PLA  appeared  to be 90% at the origin   . CARDIOVASCULAR STRESS TEST  12/12/2010  dr berry   Abnormal Low Risk nuclear study (compared to previous study, no signigicant change) moderate to severe perfusion defect , scar/infarct, w/ mild perinfarct ischemia in the apical anterior, apical, basal inferoseptal , basal inferor, mid inferoseptal, mid inferor and apical inferior  regions/ post-stress EF 37% w/ dyskinesis in the apical regions & moderate hypokinesi basal & mid inferior regions  . COLONOSCOPY  01/28/2016  . CORONARY ANGIOPLASTY WITH STENT PLACEMENT  09/092009    dr berry   Cutting  balloon atherectomy Mid Circ 2.5x12 Promus; Ostium of the 1st marginal branch angoiplasty result 20-30%;RCA stented 2.5 x 15 Promus post dilated w/ 2.75x12 [place] reduce80-90% proximal dominanat RCAto 0% (total DES x3)  . EPIDIDYMECTOMY Left 06/05/2016   Procedure: LEFT SCROTAL EXPLORATION WITH MULTIPLE REMOVALS OF EPIDIDYMAL CYSTS;  Surgeon: Jethro Bolus, MD;  Location: [facility] [place];  Service: Urology;  Laterality: Left;  . LUMBAR LAMINECTOMY  1988  . TRANSTHORACIC ECHOCARDIOGRAM  04/18/2015   very mild LVH,  ef 45%, akinesis of the mid anterior, mid anteroseptal , and apical septal ; severe hypokinesis of the apical inferior'; moderate hypokinesis of the apical anterior;  mild hypokinesis of the mid anterolateral, apical lateral, and apical;  grade 2 diastolic dysfunction/  mild to moderate AV calcification without stenosis/  mild MR and TR/  mild reduced RVSF/      FAMHx:  Family History  Problem Relation Age of Onset  . Stroke Mother   . Cancer Mother   . Heart disease Father     SOCHx:   reports that he has been smoking cigarettes. He has a 94.00 pack-year smoking history. He has never used smokeless tobacco. He reports that he does not drink alcohol and does not use drugs.  ALLERGIES:  No Known Allergies  ROS: Pertinent items noted in HPI and remainder of comprehensive ROS otherwise negative.  HOME MEDS: Current Outpatient Medications  Medication Sig Dispense Refill  . aspirin 81 MG tablet Take 81 mg by mouth daily.    . carvedilol (COREG) 12.5 MG tablet TAKE 1 TABLET (12.5 MG TOTAL) BY MOUTH 2 (TWO) TIMES DAILY. 180 tablet 3  . clopidogrel (PLAVIX) 75 MG tablet TAKE 1 TABLET BY MOUTH EVERY DAY 90 tablet 2  . ENTRESTO 49-51 MG TAKE 1  TABLET BY MOUTH TWICE A DAY 180 tablet 2  . ezetimibe (ZETIA) 10 MG tablet Take 1 tablet (10 mg total) by mouth daily. 90 tablet 3  . sildenafil (REVATIO) 20 MG tablet TAKE 2 TO 3 TABLETS BY MOUTH AS NEEDED FOR SEXUAL ACTIVITY 50 tablet 1  . simvastatin (ZOCOR) 80 MG tablet TAKE 1 TABLET (80 MG TOTAL) BY MOUTH AT BEDTIME. 90 tablet 3   No current facility-administered medications for this visit.   Facility-Administered Medications Ordered in Other Visits  Medication Dose Route Frequency Provider Last Rate Last Admin  . acetaminophen (TYLENOL) tablet 975 mg  975 mg Oral Q6H PRN Jethro Bolus, MD        LABS/IMAGING: No results found for this or any previous visit (from the past 48 hour(s)). No results found.  VITALS: BP 128/76   Pulse 68   Ht 6' (1.829 m)   Wt 179 lb 6.4 oz (81.4 kg)   BMI 24.33 kg/m   EXAM: General appearance: alert and no distress Neck: no JVD, supple, symmetrical, trachea midline and thyroid not enlarged, symmetric, no tenderness/mass/nodules Lungs: clear to auscultation bilaterally Heart: regular rate and rhythm Abdomen: soft, non-tender; bowel sounds normal; no masses,  no organomegaly Extremities: extremities normal, atraumatic, no cyanosis or edema Pulses: 2+ and symmetric Skin: Skin color, texture, turgor normal. No rashes or lesions Neurologic: Grossly normal Psych: Pleasant  EKG: Sinus rhythm with PVC's, low voltage QRS, inferior infarct pattern - personally reviewed  ASSESSMENT: 1. Ischemic cardiomyopathy, EF 35%, with inferolateral and anteroapical scar, NYHA Class I-II symptoms - confirmed by cMRI with improved LVEF to 41% (12/2018) 2. Hypertension 3. Hyperlipidemia 4. Continued tobacco abuse 5. ED 6. BPH 7. Probable COPD  8. Mild carotid artery disease 9. Right subclavian artery stenosis  PLAN: 1.   Tyrone Stanton continues to feel well. He has good blood pressure control and endorses NYHA Class I symptoms. He is able to do physical  work without difficulty. He is do for repeat labwork and we'll plan CMET, CBC, A1c and lipid today. He is fasting. We again discussed smoking cessation. He is currently at 1 1/2 ppd - if he could cut back that would help. I also advised him to try and establish with a PCP since I am not providing comprehensive primary care services for him.  Follow-up with me annually or as necessary.  Chrystie Nose, MD, [facility], FACP  [place]  [facility]  Medical Director of the Advanced Lipid Disorders &  Cardiovascular Risk Reduction Clinic Diplomate of the American Board of Clinical Lipidology Attending Cardiologist  Direct Dial: [phone]  Fax: [phone]  Website:  [email or web address] Nicely Docie Abramovich 07/22/2020, 8:06 AM

## 2020-07-22 NOTE — Patient Instructions (Signed)
Medication Instructions:  Your physician recommends that you continue on your current medications as directed. Please refer to the Current Medication list given to you today.  *If you need a refill on your cardiac medications before your next appointment, please call your pharmacy*   Lab Work: CBC, CMET, A1c, Lipid Panel   If you have labs (blood work) drawn today and your tests are completely normal, you will receive your results only by: Marland Kitchen MyChart Message (if you have MyChart) OR . A paper copy in the mail If you have any lab test that is abnormal or we need to change your treatment, we will call you to review the results.   Testing/Procedures: NONE   Follow-Up: At [facility], you and your health needs are our priority.  As part of our continuing mission to provide you with exceptional heart care, we have created designated Provider Care Teams.  These Care Teams include your primary Cardiologist (physician) and Advanced Practice Providers (APPs -  Physician Assistants and Nurse Practitioners) who all work together to provide you with the care you need, when you need it.  We recommend signing up for the patient portal called "MyChart".  Sign up information is provided on this After Visit Summary.  MyChart is used to connect with patients for Virtual Visits (Telemedicine).  Patients are able to view lab/test results, encounter notes, upcoming appointments, etc.  Non-urgent messages can be sent to your provider as well.   To learn more about what you can do with MyChart, go to [email or web address].    Your next appointment:   12 month(s)  The format for your next appointment:   In Person  Provider:   You may see Chrystie Nose, MD or one of the following Advanced Practice Providers on your designated Care Team:    Azalee Course, PA-C  Micah Flesher, PA-C or   Judy Pimple, [place]    Other Instructions

## 2020-07-23 LAB — CBC
Hematocrit: 47.2 % (ref 37.5–51.0)
Hemoglobin: 15.8 g/dL (ref 13.0–17.7)
MCH: 30 pg (ref 26.6–33.0)
MCHC: 33.5 g/dL (ref 31.5–35.7)
MCV: 90 fL (ref 79–97)
Platelets: 323 x10E3/uL (ref 150–450)
RBC: 5.27 x10E6/uL (ref 4.14–5.80)
RDW: 12.8 % (ref 11.6–15.4)
WBC: 7.1 x10E3/uL (ref 3.4–10.8)

## 2020-07-23 LAB — COMPREHENSIVE METABOLIC PANEL WITH GFR
ALT: 15 IU/L (ref 0–44)
AST: 20 IU/L (ref 0–40)
Albumin/Globulin Ratio: 1.6 (ref 1.2–2.2)
Albumin: 4.3 g/dL (ref 3.8–4.8)
Alkaline Phosphatase: 62 IU/L (ref 44–121)
BUN/Creatinine Ratio: 11 (ref 10–24)
BUN: 9 mg/dL (ref 8–27)
Bilirubin Total: 0.4 mg/dL (ref 0.0–1.2)
CO2: 22 mmol/L (ref 20–29)
Calcium: 9.5 mg/dL (ref 8.6–10.2)
Chloride: 102 mmol/L (ref 96–106)
Creatinine, Ser: 0.8 mg/dL (ref 0.76–1.27)
Globulin, Total: 2.7 g/dL (ref 1.5–4.5)
Glucose: 102 mg/dL — ABNORMAL HIGH (ref 65–99)
Potassium: 5 mmol/L (ref 3.5–5.2)
Sodium: 138 mmol/L (ref 134–144)
Total Protein: 7 g/dL (ref 6.0–8.5)
eGFR: 98 mL/min/1.73

## 2020-07-23 LAB — LIPID PANEL
Chol/HDL Ratio: 3.8 ratio (ref 0.0–5.0)
Cholesterol, Total: 105 mg/dL (ref 100–199)
HDL: 28 mg/dL — ABNORMAL LOW (ref >39)
LDL Chol Calc (NIH): 62 mg/dL (ref 0–99)
Triglycerides: 73 mg/dL (ref 0–149)
VLDL Cholesterol Cal: 15 mg/dL (ref 5–40)

## 2020-07-23 LAB — HEMOGLOBIN A1C
Est. average glucose Bld gHb Est-mCnc: 128 mg/dL
Hgb A1c MFr Bld: 6.1 % — ABNORMAL HIGH (ref 4.8–5.6)

## 2020-07-24 ENCOUNTER — Encounter: Payer: Self-pay | Admitting: Internal Medicine

## 2020-08-25 ENCOUNTER — Other Ambulatory Visit: Payer: Self-pay | Admitting: Internal Medicine

## 2020-09-27 ENCOUNTER — Other Ambulatory Visit: Payer: Self-pay | Admitting: Internal Medicine

## 2020-10-18 ENCOUNTER — Other Ambulatory Visit: Payer: Self-pay | Admitting: Internal Medicine

## 2020-10-23 ENCOUNTER — Other Ambulatory Visit: Payer: Self-pay | Admitting: Internal Medicine

## 2020-12-23 ENCOUNTER — Other Ambulatory Visit: Payer: Self-pay | Admitting: Internal Medicine

## 2021-01-04 ENCOUNTER — Other Ambulatory Visit: Payer: Self-pay | Admitting: Internal Medicine

## 2021-05-14 ENCOUNTER — Other Ambulatory Visit: Payer: Self-pay | Admitting: Internal Medicine

## 2021-06-08 ENCOUNTER — Other Ambulatory Visit: Payer: Self-pay | Admitting: Internal Medicine

## 2021-07-06 ENCOUNTER — Other Ambulatory Visit: Payer: Self-pay | Admitting: Internal Medicine

## 2021-08-01 ENCOUNTER — Other Ambulatory Visit: Payer: Self-pay | Admitting: Internal Medicine

## 2021-08-30 ENCOUNTER — Other Ambulatory Visit: Payer: Self-pay | Admitting: Internal Medicine

## 2021-09-16 ENCOUNTER — Other Ambulatory Visit: Payer: Self-pay | Admitting: Internal Medicine

## 2021-09-18 ENCOUNTER — Other Ambulatory Visit: Payer: Self-pay | Admitting: Internal Medicine

## 2021-10-11 NOTE — Progress Notes (Unsigned)
Cardiology Clinic Note   Patient Name: Tyrone Stanton Date of Encounter: 10/13/2021  Primary Care Provider:  Patient, No Pcp Per (Inactive) Primary Cardiologist:  Chrystie Nose, MD  Patient Profile    Tyrone Stanton 66 year old male presents to the clinic today for follow-up evaluation of his ischemic cardiomyopathy and coronary artery disease.  Past Medical History    Past Medical History:  Diagnosis Date   BPH with urinary obstruction    Carotid bruit    asymptomatic ;Carotid doppler 2014 mild RICA plaque   COPD with emphysema (HCC)    Coronary artery disease cardiologist-  dr hilty   hx large anteroapical MI  s/p  PCI with DES x3  to mCFX, pRCA, and OM1--- last Echo-45-50%,LV normal   Dyslipidemia    ED (erectile dysfunction)    Epididymal cyst    left   Full dentures    History of adenomatous polyp of colon    01-28-2016  tubular adenoma's   History of anteroapical myocardial infarction 12-29-2007    dr berry   pt entirely asymptomatic w/  positive Myoview--- per Cardiac Cath  3 vessel disease w/ mild to mod. LV dyfunction   Hypertension    Ischemic cardiomyopathy per echo 04-18-2015 -- ef 45%   12-12-2010  Exercise stress test-EF 37% mod. to severe perfusion defect to infarct \\scar  with mild perinfarct ischemia -apical anterior,apical basal inferioseptal, basal inferior, mid inferoseptal, mid inferior and apical inferior regions. the post stess LV  normal.;[employer] class l and II symptoms   LAFB (left anterior fascicular block)    S/P drug eluting coronary stent placement 01/11/2008   x3  to midCFX,  pRCA, and OM1   Smokers' cough (HCC)    intermittant productive   Weak urinary stream    Wears glasses    Past Surgical History:  Procedure Laterality Date   CARDIAC CATHETERIZATION  12/29/2007   dr berry   LAD-long segmental 50 to 60% after the first mod. diagonal branch btwn ST1 and ST2; Lge OM2 80% proximal; RCA,RV branch 80 to 90% mid; RCA gave off  PDA and PLA  appeared  to be 90% at the origin    CARDIOVASCULAR STRESS TEST  12/12/2010  dr berry   Abnormal Low Risk nuclear study (compared to previous study, no signigicant change) moderate to severe perfusion defect , scar/infarct, w/ mild perinfarct ischemia in the apical anterior, apical, basal inferoseptal , basal inferor, mid inferoseptal, mid inferor and apical inferior regions/ post-stress EF 37% w/ dyskinesis in the apical regions & moderate hypokinesi basal & mid inferior regions   COLONOSCOPY  01/28/2016   CORONARY ANGIOPLASTY WITH STENT PLACEMENT  09/092009    dr berry   Cutting balloon atherectomy Mid Circ 2.5x12 Promus; Ostium of the 1st marginal branch angoiplasty result 20-30%;RCA stented 2.5 x 15 Promus post dilated w/ 2.75x12  Sprinter reduce80-90% proximal dominanat RCAto 0% (total DES x3)   EPIDIDYMECTOMY Left 06/05/2016   Procedure: LEFT SCROTAL EXPLORATION WITH MULTIPLE REMOVALS OF EPIDIDYMAL CYSTS;  Surgeon: Jethro Bolus, MD;  Location: [facility];  Service: Urology;  Laterality: Left;   LUMBAR LAMINECTOMY  1988   TRANSTHORACIC ECHOCARDIOGRAM  04/18/2015   very mild LVH,  ef 45%, akinesis of the mid anterior, mid anteroseptal , and apical septal ; severe hypokinesis of the apical inferior'; moderate hypokinesis of the apical anterior;  mild hypokinesis of the mid anterolateral, apical lateral, and apical;  grade 2 diastolic dysfunction/  mild to moderate AV  calcification without stenosis/  mild MR and TR/  mild reduced RVSF/      Allergies  No Known Allergies  History of Present Illness    Tyrone Stanton has a PMH of ischemic cardiomyopathy, coronary artery disease, hypertension, HLD, essential hypertension, carotid artery disease, BPH, COPD, and tobacco abuse.  He underwent cardiac catheterization 2009.  He received PCI with DES to his circumflex, proximal RCA, and OM1.  He underwent stress testing 8/12 which showed no significant changes from his  previous study and moderate-severe perfusion defect with scar/infarct with mild peri-infarct ischemia and an EF of 37%.  Echocardiogram 12/16 showed an LVEF of 45% with akinesis of the mid anterior and mid anterior lateral and apical septal region.  He was seen by Dr. Rennis Golden on 07/22/2020.  During that time he reported that he did not have a PCP.  He was receiving some care from his urologist.  He felt well and appeared to be NYHA class I.  His blood pressure was well controlled.  He continues to smoke.  He previously was not able to tolerate Chantix.  His last ejection fraction was found to be around 40% with scarring on the inferior lateral wall.  He denied any productive cough with his COPD.  He was not taking any inhalers.  He presents to the clinic today for follow-up evaluation and states he feels well.  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  He reports that he must climb in and out of his truck and up and down off machines.  He does this without difficulty.  We reviewed the importance of low-sodium diet.  He reports some dietary indiscretion.  He continues to smoke around 2 packs/day.  Smoking cessation was recommended.  I will order a CBC, CMP, lipids and LFTs.  I will give the salty 6 diet sheet and have him maintain his physical activity.  We will plan follow-up in 12 months.  Today he denies chest pain, shortness of breath, lower extremity edema, fatigue, palpitations, melena, hematuria, hemoptysis, diaphoresis, weakness, presyncope, syncope, orthopnea, and PND.    Home Medications    Prior to Admission medications   Medication Sig Start Date End Date Taking? Authorizing Provider  aspirin 81 MG tablet Take 81 mg by mouth daily.    [provider]  carvedilol (COREG) 12.5 MG tablet TAKE 1 TABLET (12.5 MG TOTAL) BY MOUTH 2 (TWO) TIMES DAILY. 01/07/21   Hilty, Lisette Abu, MD  clopidogrel (PLAVIX) 75 MG tablet Take 1 tablet (75 mg total) by  mouth daily. 09/18/21   Chrystie Nose, MD  ENTRESTO 49-51 MG TAKE 1 TABLET BY MOUTH TWICE A DAY 10/23/20   Hilty, Lisette Abu, MD  ezetimibe (ZETIA) 10 MG tablet TAKE 1 TABLET BY MOUTH DAILY. SCHEDULE OFFICE VISIT FOR FUTURE REFILLS 09/16/21   Chrystie Nose, MD  sildenafil (REVATIO) 20 MG tablet TAKE 2-3 TABLETS BY MOUTH AS NEEDED 10/18/20   Hilty, Lisette Abu, MD  simvastatin (ZOCOR) 80 MG tablet TAKE 1 TABLET (80 MG TOTAL) BY MOUTH AT BEDTIME. 12/23/20   Hilty, Lisette Abu, MD    Family History    Family History  Problem Relation Age of Onset   Stroke Mother    Cancer Mother    Heart disease Father    He indicated that his mother is alive. He indicated that his father is deceased. He indicated that his sister is alive. He indicated that both of his brothers  are alive.  Social History    Social History   Socioeconomic History   Marital status: Widowed    Spouse name: Not on file   Number of children: 0   Years of education: Not on file   Highest education level: Not on file  Occupational History    Employer: [employer]  Tobacco Use   Smoking status: Every Day    Packs/day: 2.00    Years: 47.00    Total pack years: 94.00    Types: Cigarettes   Smokeless tobacco: Never  Substance and Sexual Activity   Alcohol use: No   Drug use: No   Sexual activity: Not on file  Other Topics Concern   Not on file  Social History Narrative   Not on file   Social Determinants of Health   Financial Resource Strain: Not on file  Food Insecurity: Not on file  Transportation Needs: Not on file  Physical Activity: Not on file  Stress: Not on file  Social Connections: Not on file  Intimate Partner Violence: Not on file     Review of Systems    General:  No chills, fever, night sweats or weight changes.  Cardiovascular:  No chest pain, dyspnea on exertion, edema, orthopnea, palpitations, paroxysmal nocturnal dyspnea. Dermatological: No rash, lesions/masses Respiratory: No  cough, dyspnea Urologic: No hematuria, dysuria Abdominal:   No nausea, vomiting, diarrhea, bright red blood per rectum, melena, or hematemesis Neurologic:  No visual changes, wkns, changes in mental status. All other systems reviewed and are otherwise negative except as noted above.  Physical Exam    VS:  BP 130/70 (BP Location: Left Arm)   Pulse 68   Ht 6' (1.829 m)   Wt 184 lb 6.4 oz (83.6 kg)   SpO2 99%   BMI 25.01 kg/m  , BMI Body mass index is 25.01 kg/m. GEN: Well nourished, well developed, in no acute distress. HEENT: normal. Neck: Supple, no JVD, carotid bruits, or masses. Cardiac: RRR, no murmurs, rubs, or gallops. No clubbing, cyanosis, edema.  Radials/DP/PT 2+ and equal bilaterally.  Respiratory:  Respirations regular and unlabored, clear to auscultation bilaterally. GI: Soft, nontender, nondistended, BS + x 4. MS: no deformity or atrophy. Skin: warm and dry, no rash. Neuro:  Strength and sensation are intact. Psych: Normal affect.  Accessory Clinical Findings    Recent Labs: No results found for requested labs within last 365 days.   Recent Lipid Panel    Component Value Date/Time   CHOL 105 07/22/2020 0000   TRIG 73 07/22/2020 0000   HDL 28 (L) 07/22/2020 0000   CHOLHDL 3.8 07/22/2020 0000   CHOLHDL 3.9 01/23/2015 1002   VLDL 20 01/23/2015 1002   LDLCALC 62 07/22/2020 0000    ECG personally reviewed by me today-normal sinus rhythm with posterior fascicular block inferior infarct undetermined age anterior infarct undetermined age 17 bpm- No acute changes  Echocardiogram 08/22/2019 IMPRESSIONS     1. Left ventricular ejection fraction, by estimation, is 40 to 45%. The  left ventricle has mildly decreased function. The left ventricle  demonstrates regional wall motion abnormalities (see scoring  diagram/findings for description). Left ventricular  diastolic parameters are consistent with Grade I diastolic dysfunction  (impaired relaxation).   2. Right  ventricular systolic function is normal. The right ventricular  size is normal. There is normal pulmonary artery systolic pressure. The  estimated right ventricular systolic pressure is 32.8 mmHg.   3. The mitral valve is grossly normal. Mild mitral valve  regurgitation.  No evidence of mitral stenosis.   4. The aortic valve is tricuspid. Aortic valve regurgitation is not  visualized. No aortic stenosis is present.   5. The inferior vena cava is normal in size with greater than 50%  respiratory variability, suggesting right atrial pressure of 3 mmHg.   Comparison(s): Changes from prior study are noted. EF improved to 40-45%.  WMA consistent with prior LAD infarction.   Conclusion(s)/Recommendation(s): Findings consistent with ischemic  cardiomyopathy.   Assessment & Plan   1.  Coronary artery disease-no recent episodes of arm neck back or chest discomfort.  Underwent cardiac catheterization with PCI and DES x3.  Details above.  EF decreased to 37.  On most recent echocardiogram EF 40-45%. Continue aspirin, carvedilol, Entresto, simvastatin Heart healthy low-sodium diet Increase physical activity as tolerated Order CBC, CMP  Ischemic cardiomyopathy-NYHA class I-II.  No increased DOE or activity intolerance.  Echocardiogram 08/22/2019 showed LVEF of 40-45% and G1 DD.  Details above. Continue carvedilol, Entresto, Heart healthy low-sodium diet-salty 6 given Increase physical activity as tolerated   Essential hypertension-BP today 130/70.  Well-controlled at home. Continue carvedilol, Entresto Heart healthy low-sodium diet-salty 6 given Increase physical activity as tolerated   Hyperlipidemia-LDL 62 3/22 Continue simvastatin, ezetimibe, aspirin Heart healthy low-sodium high-fiber diet Increase physical activity as tolerated Lipids and lft's  Disposition: Follow-up with Dr. Rennis GoldenHilty or me in 12 months.   Thomasene RippleJesse M. Hilary Milks NP-C    10/13/2021, 8:17 AM [facility] [address] Office [phone] Fax [phone]  Notice: This dictation was prepared with Dragon dictation along with smaller phrase technology. Any transcriptional errors that result from this process are unintentional and may not be corrected upon review.  I spent 14 minutes examining this patient, reviewing medications, and using patient centered shared decision making involving her cardiac care.  Prior to her visit I spent greater than 20 minutes reviewing her past medical history,  medications, and prior cardiac tests.

## 2021-10-13 ENCOUNTER — Encounter: Payer: Self-pay | Admitting: General Practice

## 2021-10-13 ENCOUNTER — Ambulatory Visit (INDEPENDENT_AMBULATORY_CARE_PROVIDER_SITE_OTHER): Payer: BC Managed Care – PPO | Admitting: General Practice

## 2021-10-13 VITALS — BP 130/70 | HR 68 | Ht 72.0 in | Wt 184.4 lb

## 2021-10-13 DIAGNOSIS — I255 Ischemic cardiomyopathy: Secondary | ICD-10-CM | POA: Diagnosis not present

## 2021-10-13 DIAGNOSIS — I2510 Atherosclerotic heart disease of native coronary artery without angina pectoris: Secondary | ICD-10-CM

## 2021-10-13 DIAGNOSIS — I10 Essential (primary) hypertension: Secondary | ICD-10-CM | POA: Diagnosis not present

## 2021-10-13 DIAGNOSIS — E785 Hyperlipidemia, unspecified: Secondary | ICD-10-CM

## 2021-10-13 LAB — COMPREHENSIVE METABOLIC PANEL
ALT: 23 IU/L (ref 0–44)
AST: 24 IU/L (ref 0–40)
Albumin/Globulin Ratio: 1.8 (ref 1.2–2.2)
Albumin: 4.4 g/dL (ref 3.8–4.8)
Alkaline Phosphatase: 60 IU/L (ref 44–121)
BUN/Creatinine Ratio: 20 (ref 10–24)
BUN: 16 mg/dL (ref 8–27)
Bilirubin Total: 0.5 mg/dL (ref 0.0–1.2)
CO2: 23 mmol/L (ref 20–29)
Calcium: 10 mg/dL (ref 8.6–10.2)
Chloride: 102 mmol/L (ref 96–106)
Creatinine, Ser: 0.81 mg/dL (ref 0.76–1.27)
Globulin, Total: 2.4 g/dL (ref 1.5–4.5)
Glucose: 103 mg/dL — ABNORMAL HIGH (ref 70–99)
Potassium: 4.4 mmol/L (ref 3.5–5.2)
Sodium: 138 mmol/L (ref 134–144)
Total Protein: 6.8 g/dL (ref 6.0–8.5)
eGFR: 97 mL/min/{1.73_m2} (ref >59)

## 2021-10-13 LAB — CBC
Hematocrit: 44.2 % (ref 37.5–51.0)
Hemoglobin: 15.1 g/dL (ref 13.0–17.7)
MCH: 31.2 pg (ref 26.6–33.0)
MCHC: 34.2 g/dL (ref 31.5–35.7)
MCV: 91 fL (ref 79–97)
Platelets: 254 10*3/uL (ref 150–450)
RBC: 4.84 x10E6/uL (ref 4.14–5.80)
RDW: 12.3 % (ref 11.6–15.4)
WBC: 8.8 10*3/uL (ref 3.4–10.8)

## 2021-10-13 LAB — LIPID PANEL
Chol/HDL Ratio: 2.9 ratio (ref 0.0–5.0)
Cholesterol, Total: 103 mg/dL (ref 100–199)
HDL: 35 mg/dL — ABNORMAL LOW (ref >39)
LDL Chol Calc (NIH): 50 mg/dL (ref 0–99)
Triglycerides: 94 mg/dL (ref 0–149)
VLDL Cholesterol Cal: 18 mg/dL (ref 5–40)

## 2021-10-13 LAB — HEPATIC FUNCTION PANEL: Bilirubin, Direct: 0.15 mg/dL (ref 0.00–0.40)

## 2021-10-13 MED ORDER — CLOPIDOGREL BISULFATE 75 MG PO TABS: 75.0000 mg | ORAL_TABLET | Freq: Every day | ORAL | 3 refills | Status: DC

## 2021-10-13 MED ORDER — EZETIMIBE 10 MG PO TABS: ORAL_TABLET | ORAL | 3 refills | Status: DC

## 2021-10-13 MED ORDER — SIMVASTATIN 80 MG PO TABS: 80.0000 mg | ORAL_TABLET | Freq: Every day | ORAL | 3 refills | Status: DC

## 2021-10-13 MED ORDER — CARVEDILOL 12.5 MG PO TABS: 12.5000 mg | ORAL_TABLET | Freq: Two times a day (BID) | ORAL | 3 refills | Status: DC

## 2021-10-13 MED ORDER — ENTRESTO 49-51 MG PO TABS: 1.0000 | ORAL_TABLET | Freq: Two times a day (BID) | ORAL | 3 refills | Status: DC

## 2021-10-13 NOTE — Patient Instructions (Signed)
Medication Instructions:  The current medical regimen is effective;  continue present plan and medications as directed. Please refer to the Current Medication list given to you today.   *If you need a refill on your cardiac medications before your next appointment, please call your pharmacy*  Lab Work:    CBC,CMET, LIPID,LFT TODAY   If you have labs (blood work) drawn today and your tests are completely normal, you will receive your results only by: MyChart Message (if you have MyChart) OR  A paper copy in the mail If you have any lab test that is abnormal or we need to change your treatment, we will call you to review the results.  Special Instructions PLEASE READ AND FOLLOW SALTY 6-ATTACHED-1,800mg  daily  PLEASE MAINTAIN PHYSICAL ACTIVITY AS TOLERATED   PLEASE READ AND FOLLOW SMOKING CESSATION TIPS-ATTACHED  Follow-Up: Your next appointment:  12 month(s) In Person with Chrystie Nose, MD  or Edd Fabian, FNP      Please call our office 2 months in advance to schedule this appointment  :1  At [facility], you and your health needs are our priority.  As part of our continuing mission to provide you with exceptional heart care, we have created designated Provider Care Teams.  These Care Teams include your primary Cardiologist (physician) and Advanced Practice Providers (APPs -  Physician Assistants and Nurse Practitioners) who all work together to provide you with the care you need, when you need it.  We recommend signing up for the patient portal called "MyChart".  Sign up information is provided on this After Visit Summary.  MyChart is used to connect with patients for Virtual Visits (Telemedicine).  Patients are able to view lab/test results, encounter notes, upcoming appointments, etc.  Non-urgent messages can be sent to your provider as well.   To learn more about what you can do with MyChart, go to [email or web address].     Important Information About Sugar                6 SALTY THINGS TO AVOID     1,800MG  DAILY     Steps to Quit Smoking Smoking tobacco is the leading cause of preventable death. It can affect almost every organ in the body. Smoking puts you and people around you at risk for many serious, long-lasting (chronic) diseases. Quitting smoking can be hard, but it is one of the best things that you can do for your health. It is never too late to quit. Do not give up if you cannot quit the first time. Some people need to try many times to quit. Do your best to stick to your quit plan, and talk with your doctor if you have any questions or concerns. How do I get ready to quit? Pick a date to quit. Set a date within the next 2 weeks to give you time to prepare. Write down the reasons why you are quitting. Keep this list in places where you will see it often. Tell your family, friends, and co-workers that you are quitting. Their support is important. Talk with your doctor about the choices that may help you quit. Find out if your health insurance will pay for these treatments. Know the people, places, things, and activities that make you want to smoke (triggers). Avoid them. What first steps can I take to quit smoking? Throw away all cigarettes at home, at work, and in your car. Throw away the things that you use when you smoke, such as ashtrays  and lighters. Clean your car. Empty the ashtray. Clean your home, including curtains and carpets. What can I do to help me quit smoking? Talk with your doctor about taking medicines and seeing a counselor. You are more likely to succeed when you do both. If you are pregnant or breastfeeding: Talk with your doctor about counseling or other ways to quit smoking. Do not take medicine to help you quit smoking unless your doctor tells you to. Quit right away Quit smoking completely, instead of slowly cutting back on how much you smoke over a period of time. Stopping smoking right away may be more successful  than slowly quitting. Go to counseling. In-person is best if this is an option. You are more likely to quit if you go to counseling sessions regularly. Take medicine You may take medicines to help you quit. Some medicines need a prescription, and some you can buy over-the-counter. Some medicines may contain a drug called nicotine to replace the nicotine in cigarettes. Medicines may: Help you stop having the desire to smoke (cravings). Help to stop the problems that come when you stop smoking (withdrawal symptoms). Your doctor may ask you to use: Nicotine patches, gum, or lozenges. Nicotine inhalers or sprays. Non-nicotine medicine that you take by mouth. Find resources Find resources and other ways to help you quit smoking and remain smoke-free after you quit. They include: Online chats with a Veterinary surgeon. Phone quitlines. Printed Materials engineer. Support groups or group counseling. Text messaging programs. Mobile phone apps. Use apps on your mobile phone or tablet that can help you stick to your quit plan. Examples of free services include Quit Guide from the CDC and smokefree.gov  What can I do to make it easier to quit?  Talk to your family and friends. Ask them to support and encourage you. Call a phone quitline, such as 1-800-QUIT-NOW, reach out to support groups, or work with a Veterinary surgeon. Ask people who smoke to not smoke around you. Avoid places that make you want to smoke, such as: Bars. Parties. Smoke-break areas at work. Spend time with people who do not smoke. Lower the stress in your life. Stress can make you want to smoke. Try these things to lower stress: Getting regular exercise. Doing deep-breathing exercises. Doing yoga. Meditating. What benefits will I see if I quit smoking? Over time, you may have: A better sense of smell and taste. Less coughing and sore throat. A slower heart rate. Lower blood pressure. Clearer skin. Better breathing. Fewer sick  days. Summary Quitting smoking can be hard, but it is one of the best things that you can do for your health. Do not give up if you cannot quit the first time. Some people need to try many times to quit. When you decide to quit smoking, make a plan to help you succeed. Quit smoking right away, not slowly over a period of time. When you start quitting, get help and support to keep you smoke-free. This information is not intended to replace advice given to you by your health care provider. Make sure you discuss any questions you have with your health care provider. Document Revised: 04/11/2021 Document Reviewed: 04/11/2021 Elsevier Patient Education  2023 [employer].

## 2021-10-22 ENCOUNTER — Other Ambulatory Visit: Payer: Self-pay | Admitting: Internal Medicine

## 2022-05-13 ENCOUNTER — Telehealth: Payer: Self-pay | Admitting: Internal Medicine

## 2022-05-13 NOTE — Telephone Encounter (Signed)
*  STAT* If patient is at the pharmacy, call can be transferred to refill team.   1. Which medications need to be refilled? (please list name of each medication and dose if known)  sildenafil (REVATIO) 20 MG tablet  2. Which pharmacy/location (including street and city if local pharmacy) is medication to be sent to? [place] fax#: [phone]  3. Do they need a 30 day or 90 day supply?  90 day supply

## 2022-05-19 MED ORDER — SILDENAFIL CITRATE 20 MG PO TABS: ORAL_TABLET | ORAL | 2 refills | Status: DC

## 2022-05-19 NOTE — Telephone Encounter (Signed)
Called patient as could not find the pharmacy requested. He would like Rx sent to [facility] instead, previously used

## 2022-07-23 ENCOUNTER — Other Ambulatory Visit: Payer: Self-pay | Admitting: General Practice

## 2022-11-11 ENCOUNTER — Other Ambulatory Visit: Payer: Self-pay | Admitting: General Practice

## 2022-11-14 ENCOUNTER — Other Ambulatory Visit: Payer: Self-pay | Admitting: General Practice

## 2022-11-16 ENCOUNTER — Other Ambulatory Visit: Payer: Self-pay | Admitting: Internal Medicine

## 2023-01-14 ENCOUNTER — Other Ambulatory Visit: Payer: Self-pay | Admitting: General Practice

## 2023-02-03 ENCOUNTER — Encounter: Payer: Self-pay | Admitting: Internal Medicine

## 2023-02-03 ENCOUNTER — Ambulatory Visit: Payer: BC Managed Care – PPO | Attending: Internal Medicine | Admitting: Internal Medicine

## 2023-02-03 VITALS — BP 138/60 | HR 67 | Ht 72.0 in | Wt 180.0 lb

## 2023-02-03 DIAGNOSIS — I10 Essential (primary) hypertension: Secondary | ICD-10-CM | POA: Diagnosis not present

## 2023-02-03 DIAGNOSIS — E785 Hyperlipidemia, unspecified: Secondary | ICD-10-CM

## 2023-02-03 DIAGNOSIS — R7303 Prediabetes: Secondary | ICD-10-CM | POA: Insufficient documentation

## 2023-02-03 DIAGNOSIS — I2510 Atherosclerotic heart disease of native coronary artery without angina pectoris: Secondary | ICD-10-CM

## 2023-02-03 DIAGNOSIS — I255 Ischemic cardiomyopathy: Secondary | ICD-10-CM | POA: Diagnosis not present

## 2023-02-03 MED ORDER — EMPAGLIFLOZIN 10 MG PO TABS: 10.0000 mg | ORAL_TABLET | Freq: Every day | ORAL | 3 refills | Status: DC

## 2023-02-03 MED ORDER — CARVEDILOL 12.5 MG PO TABS: 12.5000 mg | ORAL_TABLET | Freq: Two times a day (BID) | ORAL | 3 refills | Status: DC

## 2023-02-03 MED ORDER — SIMVASTATIN 80 MG PO TABS: 80.0000 mg | ORAL_TABLET | Freq: Every day | ORAL | 3 refills | Status: DC

## 2023-02-03 MED ORDER — EZETIMIBE 10 MG PO TABS: ORAL_TABLET | ORAL | 3 refills | Status: DC

## 2023-02-03 MED ORDER — ENTRESTO 49-51 MG PO TABS: 1.0000 | ORAL_TABLET | Freq: Two times a day (BID) | ORAL | 3 refills | Status: DC

## 2023-02-03 NOTE — Progress Notes (Signed)
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 He has a history of a large anteroapical infarct in the past with a low EF of less than 40%, actually 39% at his last stress test and New York Heart Association Class I-II symptoms. He continues to be generally asymptomatic and can do a high level of exercise. He is here today again for DOT physical and underwent exercise nuclear stress testing on December 12, 2010, with 8 minutes of exercise and 10 metabolic equivalents. The stress test demonstrated a moderate to severe infarct in the apical anterior, apical basal, inferoseptal, inferior, mid-inferoseptal, mid-inferior, and apical inferior regions, which is thought to be unchanged from his previous study. He describes no new cardiovascular symptoms. He is requesting another stress test per DOT guidelines.   Tyrone Stanton returns today for his annual visit. At his last office visit I recommended another stress test however it was declined by his insurance company. Probably because he is asymptomatic however it was requested per DOT guidelines which are not up-to-date. He continues to feel very well and does high level of work and exercise. He denies any chest pain or shortness of breath. We have not reassess his LV function and over 3 years and is due for repeat echocardiography. He is also not had a lipid assessment and over a year.  Tyrone Stanton returns today for follow-up. At his last office visit he had reassessment of LV function by echo which showed EF stable at 40%. Fortunately continues to smoke. 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 He continues to have problems with erectile dysfunction but seems to be well-controlled with Viagra. He is not  yet established a primary care provider. He is previously seen a urologist [name], but has not followed up.  11/22/2015  Tyrone Stanton returns today for follow-up. Over the past year he's had no new complaints. He continues to work long days and has no problems with chest pain or worsening shortness of breath. He is in need of a refill of his Revatio. He is not establish with a primary care provider. I asked him whether he never had screening colonoscopy and he has not yet. I will therefore refer him for that. 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 He's not had repeat imaging since that time. He continues to smoke about 1-1/2 packs per day. I reviewed current screening guidelines which indicate a recommendation for a CT scan of the chest and smokers that are in their 60s. I'm recommending him to get a CT scan to screen for early malignancies.  11/26/2016  Tyrone Stanton was seen today in follow-up. He reports feeling well. He says he's somewhat interested in stopping smoking however has not yet been able to do that. Recently had surgery to remove a cyst from his testicle which was benign. He is followed with [facility]. He denies any worsening chest pain. He does have shortness of breath but it's been stable. He's not had any lab work but recently had a DOT physical. Weight is down about 5 pounds and his BMI is normal. He is due for repeat lipid profile. He does not have a primary care provider.  12/27/2017  Tyrone Stanton was seen today in routine follow-up.  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  Because of this he says is not been able to stop smoking.  He is also not been able to cut back.  He denies any new chest pain or worsening shortness of breath.  We have discussed multiple times the fact that we need to continue to follow-up carotid Dopplers.  He has a right carotid bruit which  showed mild plaque in 2014 but has not been reassessed.  In addition his last echo was in 2016 which showed LVEF about 40% with anterior MI.  He denies any new weight gain, orthopnea, PND or other heart failure symptoms.  He reports compliance with his medications.  He has not had recent lab work that we could obtain.  04/15/2018  Tyrone Stanton returns today for follow-up of his studies.  He underwent a repeat echocardiogram, which unfortunately shows a decline in LVEF down to 35%.  There is evidence of fixed defect suggestive of prior MI.  His EF previously was 45 to 50%.  In addition he had bilateral carotid Dopplers which showed mild disease however there is significant stenosis of the right subclavian artery.  This was manifest by differential blood pressures of about 20 to 25 mmHg systolic between the right and left arm.  In the future is recommended that left arm blood pressures be obtained.  He currently denies any chest pain or worsening shortness of breath.  He has no more than NYHA class II symptoms.  05/24/2018  Tyrone Stanton is seen today in follow-up.  He seems to be tolerating the switch from lisinopril to [facility].  He is currently on the 49/51 mg dose.  Blood pressure is much better controlled now 110/72.  He is also on carvedilol 12.5 mg twice daily, aspirin, Plavix, Zetia and simvastatin.  Overall he feels well.  Denies any worsening shortness of breath or any significant side effects from the [place].  At this point however I do not believe we can titrate up the medication much more.  He says his blood pressure is even a little lower at home.  02/02/2019  Tyrone Stanton is seen today in follow-up.  He underwent cardiac MRI in August 2020, which demonstrated an LVEF of 41% with inferolateral akinesis, septal apical and distal anterior wall hypokinesis.  There was full-thickness scar in the inferolateral wall from the apex to base and partial thickness scar in the distal anterior wall, apex and  septum.  Overall this is a relatively good finding, considering he has known prior infarct and that his LVEF is probably as optimized as it could be discounting the areas of infarcted myocardium.  He says he is been pleased with his blood pressure control on Entresto, for which she is on moderate dose.  Blood pressure is well controlled at 130/74.  Weight is normal.  EKG shows a sinus rhythm with inferior and anterior infarct pattern.  Unfortunately continues to smoke.  We discussed that today.  At this time he is not quite ready to quit but he says he did quit for up to a year in the past and thinks he could do it again.  He had tried Chantix but had vivid dreams with it and discontinued it.  07/10/2019  Tyrone Stanton returns for annual follow-up.  Overall he continues to feel well.  He endorses NYHA class I symptoms.  As mentioned previously his cardiac  MRI showed an EF of 41% with scar and that was full-thickness in the inferolateral wall from apex to base and partial thickness scar in the distal anterior wall, apex and septum.  Tyrone Stanton continues to smoke and reports due to stress he has not been able to stop.  Blood pressure is well controlled.   07/22/2020  Tyrone Stanton is seen today for routine follow-up.  He still does not have a primary care provider.  He does get some care through a urologist.  He says he feels well and endorses NYHA class I symptoms.  His blood pressure he says has been much better controlled after changing his medications.  Unfortunately continues to smoke.  Despite having had Chantix in the past he was not able to tolerate it.  His last LVEF was around 40% with scar that was full-thickness in the inferolateral wall and distal anterior wall.  He has not had any blood work it appears since 2019.  He likely has some COPD and emphysema but denies any productive cough, worsening sputum or worsening shortness of breath.  He is not currently on any inhalers.  02/03/2023  Tyrone Stanton  follows up today.  He has done well without any reported worsening shortness of breath or chest pain.  Unfortunately continues to smoke.  He says he is close to retiring from his job.  We discussed current heart failure medicine treatments.  He has had a persistent low EF of about 40 to 45%.  He endorses NYHA class I symptoms.  He is on aspirin, Plavix carvedilol and Entresto.  There is likely little evidence for continued dual antiplatelet therapy at this point and we discussed stopping the clopidogrel.   Past Medical History:  Diagnosis Date   BPH with urinary obstruction    Carotid bruit    asymptomatic ;Carotid doppler 2014 mild RICA plaque   COPD with emphysema (HCC)    Coronary artery disease cardiologist-  dr Winston Sobczyk   hx large anteroapical MI  s/p  PCI with DES x3  to mCFX, pRCA, and OM1--- last Echo-45-50%,LV normal   Dyslipidemia    ED (erectile dysfunction)    Epididymal cyst    left   Full dentures    History of adenomatous polyp of colon    01-28-2016  tubular adenoma's   History of anteroapical myocardial infarction 12-29-2007    dr berry   pt entirely asymptomatic w/  positive Myoview--- per Cardiac Cath  3 vessel disease w/ mild to mod. LV dyfunction   Hypertension    Ischemic cardiomyopathy per echo 04-18-2015 -- ef 45%   12-12-2010  Exercise stress test-EF 37% mod. to severe perfusion defect to infarct \\scar  with mild perinfarct ischemia -apical anterior,apical basal inferioseptal, basal inferior, mid inferoseptal, mid inferior and apical inferior regions. the post stess LV  normal.;New York Heart Assoc. class l and II symptoms   LAFB (left anterior fascicular block)    S/P drug eluting coronary stent placement 01/11/2008   x3  to midCFX,  pRCA, and OM1   Smokers' cough (HCC)    intermittant productive   Weak urinary stream    Wears glasses     Past Surgical History:  Procedure Laterality Date   CARDIAC CATHETERIZATION  12/29/2007   dr berry   LAD-long segmental 50  to 60% after the first mod. diagonal branch btwn ST1 and ST2; Lge OM2 80% proximal; RCA,RV branch 80 to 90% mid; RCA gave off PDA and PLA  appeared  to be  90% at the origin    CARDIOVASCULAR STRESS TEST  12/12/2010  dr berry   Abnormal Low Risk nuclear study (compared to previous study, no signigicant change) moderate to severe perfusion defect , scar/infarct, w/ mild perinfarct ischemia in the apical anterior, apical, basal inferoseptal , basal inferor, mid inferoseptal, mid inferor and apical inferior regions/ post-stress EF 37% w/ dyskinesis in the apical regions & moderate hypokinesi basal & mid inferior regions   COLONOSCOPY  01/28/2016   CORONARY ANGIOPLASTY WITH STENT PLACEMENT  09/092009    dr berry   Cutting balloon atherectomy Mid Circ 2.5x12 Promus; Ostium of the 1st marginal branch angoiplasty result 20-30%;RCA stented 2.5 x 15 Promus post dilated w/ 2.75x12 [place] reduce80-90% proximal dominanat RCAto 0% (total DES x3)   EPIDIDYMECTOMY Left 06/05/2016   Procedure: LEFT SCROTAL EXPLORATION WITH MULTIPLE REMOVALS OF EPIDIDYMAL CYSTS;  Surgeon: Jethro Bolus, MD;  Location: [facility] ;  Service: Urology;  Laterality: Left;   LUMBAR LAMINECTOMY  1988   TRANSTHORACIC ECHOCARDIOGRAM  04/18/2015   very mild LVH,  ef 45%, akinesis of the mid anterior, mid anteroseptal , and apical septal ; severe hypokinesis of the apical inferior'; moderate hypokinesis of the apical anterior;  mild hypokinesis of the mid anterolateral, apical lateral, and apical;  grade 2 diastolic dysfunction/  mild to moderate AV calcification without stenosis/  mild MR and TR/  mild reduced RVSF/      FAMHx:  Family History  Problem Relation Age of Onset   Stroke Mother    Cancer Mother    Heart disease Father     SOCHx:   reports that he has been smoking cigarettes. He has a 94 pack-year smoking history. He has never used smokeless tobacco. He reports that he does not drink alcohol and does not  use drugs.  ALLERGIES:  No Known Allergies  ROS: Pertinent items noted in HPI and remainder of comprehensive ROS otherwise negative.  HOME MEDS: Current Outpatient Medications  Medication Sig Dispense Refill   aspirin 81 MG tablet Take 81 mg by mouth daily.     carvedilol (COREG) 12.5 MG tablet TAKE 1 TABLET BY MOUTH 2 TIMES DAILY. 180 tablet 3   clopidogrel (PLAVIX) 75 MG tablet TAKE 1 TABLET BY MOUTH EVERY DAY 90 tablet 3   ENTRESTO 49-51 MG TAKE 1 TABLET BY MOUTH TWICE A DAY 180 tablet 3   ezetimibe (ZETIA) 10 MG tablet TAKE 1 TABLET BY MOUTH DAILY. 90 tablet 3   sildenafil (REVATIO) 20 MG tablet TAKE 2-3 TABLETS BY MOUTH AS NEEDED 150 tablet 0   simvastatin (ZOCOR) 80 MG tablet TAKE 1 TABLET BY MOUTH EVERYDAY AT BEDTIME 90 tablet 3   No current facility-administered medications for this visit.   Facility-Administered Medications Ordered in Other Visits  Medication Dose Route Frequency Provider Last Rate Last Admin   acetaminophen (TYLENOL) tablet 975 mg  975 mg Oral Q6H PRN Jethro Bolus, MD        LABS/IMAGING: No results found for this or any previous visit (from the past 48 hour(s)). No results found.  VITALS: BP 138/60 (BP Location: Left Arm, Patient Position: Sitting, Cuff Size: Normal)   Pulse 67   Ht 6' (1.829 m)   Wt 180 lb (81.6 kg)   SpO2 94%   BMI 24.41 kg/m   EXAM: General appearance: alert and no distress Neck: no JVD, supple, symmetrical, trachea midline and thyroid not enlarged, symmetric, no tenderness/mass/nodules Lungs: clear to auscultation bilaterally Heart: regular rate and  rhythm Abdomen: soft, non-tender; bowel sounds normal; no masses,  no organomegaly Extremities: extremities normal, atraumatic, no cyanosis or edema Pulses: 2+ and symmetric Skin: Skin color, texture, turgor normal. No rashes or lesions Neurologic: Grossly normal Psych: Pleasant  EKG: EKG Interpretation Date/Time:  Wednesday February 03 2023 11:03:50  EDT Ventricular Rate:  67 PR Interval:    QRS Duration:  74 QT Interval:  414 QTC Calculation: 437 R Axis:   105  Text Interpretation: Ectopic atrial rhythm Rightward axis Low voltage QRS Inferior infarct (cited on or before 11-Jan-2008) Cannot rule out Anterior infarct (cited on or before 11-Jan-2008) When compared with ECG of 12-Jan-2008 05:39, Junctional rhythm has replaced Sinus rhythm Nonspecific T wave abnormality no longer evident in Lateral leads Confirmed by Zoila Shutter [phone]) on 02/03/2023 11:32:29 AM    ASSESSMENT: Ischemic cardiomyopathy, EF 35%, with inferolateral and anteroapical scar, NYHA Class I-II symptoms - confirmed by cMRI with improved LVEF to 41% (12/2018) Hypertension Hyperlipidemia Continued tobacco abuse ED BPH Probable COPD Mild carotid artery disease Right subclavian artery stenosis  PLAN: 1.   Tyrone Stanton has not had repeat imaging in 4 years.  His EF remained reduced at 40 to 45%.  There are newer standards of care regarding heart failure medicine therapy.  I think he would benefit from the addition of an SGLT2 inhibitor.  He has had prediabetes with an A1c of 6.1% in the past.  Will go ahead and repeat an A1c as well as metabolic profile after starting on Jardiance 10 mg daily.  I also think we can stop his clopidogrel at this point and maintain him on low-dose aspirin.  Lipids in June showed good control with total cholesterol 103, HDL 35, triglycerides 94 and LDL 50.  No changes to his simvastatin and ezetimibe.  Follow-up annually or as necessary.  Chrystie Nose, MD, [facility], FACP  [place]  [facility]  Medical Director of the Advanced Lipid Disorders &  Cardiovascular Risk Reduction Clinic Diplomate of the American Board of Clinical Lipidology Attending Cardiologist  Direct Dial: [phone]  Fax: [phone]  Website:  [email or web address] Nicely Harim Bi 02/03/2023, 11:32 AM

## 2023-02-03 NOTE — Patient Instructions (Signed)
Medication Instructions:   STOP clopidogrel (plavix)  START jardiance 10mg  daily  *If you need a refill on your cardiac medications before your next appointment, please call your pharmacy*   Lab Work: Non-Fasting lab work in 2 weeks -- A1c and BMET  If you have labs (blood work) drawn today and your tests are completely normal, you will receive your results only by: MyChart Message (if you have MyChart) OR A paper copy in the mail If you have any lab test that is abnormal or we need to change your treatment, we will call you to review the results.   Testing/Procedures: Your physician has requested that you have an echocardiogram. Echocardiography is a painless test that uses sound waves to create images of your heart. It provides your doctor with information about the size and shape of your heart and how well your heart's chambers and valves are working. This procedure takes approximately one hour. There are no restrictions for this procedure. Please do NOT wear cologne, perfume, aftershave, or lotions (deodorant is allowed). Please arrive 15 minutes prior to your appointment time.    Follow-Up: At [facility], you and your health needs are our priority.  As part of our continuing mission to provide you with exceptional heart care, we have created designated Provider Care Teams.  These Care Teams include your primary Cardiologist (physician) and Advanced Practice Providers (APPs -  Physician Assistants and Nurse Practitioners) who all work together to provide you with the care you need, when you need it.  We recommend signing up for the patient portal called "MyChart".  Sign up information is provided on this After Visit Summary.  MyChart is used to connect with patients for Virtual Visits (Telemedicine).  Patients are able to view lab/test results, encounter notes, upcoming appointments, etc.  Non-urgent messages can be sent to your provider as well.   To learn more about what  you can do with MyChart, go to [email or web address].    Your next appointment:   12 months with Dr. Rennis Golden

## 2023-03-08 ENCOUNTER — Ambulatory Visit (HOSPITAL_COMMUNITY): Payer: BC Managed Care – PPO | Attending: Internal Medicine

## 2023-03-08 DIAGNOSIS — I255 Ischemic cardiomyopathy: Secondary | ICD-10-CM | POA: Diagnosis not present

## 2023-03-08 LAB — ECHOCARDIOGRAM COMPLETE
Area-P 1/2: 3.31 cm2
S' Lateral: 3.8 cm

## 2023-03-08 MED ORDER — PERFLUTREN LIPID MICROSPHERE
1.0000 mL | INTRAVENOUS | Status: AC | PRN
  Administered 2023-03-08: 3 mL via INTRAVENOUS

## 2023-03-09 LAB — BASIC METABOLIC PANEL
BUN/Creatinine Ratio: 14 (ref 10–24)
BUN: 13 mg/dL (ref 8–27)
CO2: 24 mmol/L (ref 20–29)
Calcium: 10.4 mg/dL — ABNORMAL HIGH (ref 8.6–10.2)
Chloride: 102 mmol/L (ref 96–106)
Creatinine, Ser: 0.9 mg/dL (ref 0.76–1.27)
Glucose: 96 mg/dL (ref 70–99)
Potassium: 5.6 mmol/L — ABNORMAL HIGH (ref 3.5–5.2)
Sodium: 140 mmol/L (ref 134–144)
eGFR: 92 mL/min/{1.73_m2} (ref >59)

## 2023-03-09 LAB — HEMOGLOBIN A1C
Est. average glucose Bld gHb Est-mCnc: 126 mg/dL
Hgb A1c MFr Bld: 6 % — ABNORMAL HIGH (ref 4.8–5.6)

## 2023-03-11 ENCOUNTER — Encounter: Payer: Self-pay | Admitting: Internal Medicine

## 2023-06-09 ENCOUNTER — Telehealth: Payer: Self-pay | Admitting: Internal Medicine

## 2023-06-09 NOTE — Telephone Encounter (Signed)
 Pt c/o medication issue:  1. Name of Medication:   empagliflozin  (JARDIANCE ) 10 MG TABS tablet    2. How are you currently taking this medication (dosage and times per day)?    3. Are you having a reaction (difficulty breathing--STAT)? no  4. What is your medication issue? 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 Please advise

## 2023-06-09 NOTE — Telephone Encounter (Signed)
 error

## 2023-06-10 ENCOUNTER — Encounter: Payer: Self-pay | Admitting: Internal Medicine

## 2023-06-11 NOTE — Telephone Encounter (Signed)
  The patient is calling back to follow up. He stated that the letter can be faxed to [facility] but does not know the fax number. However, he provided the phone number [phone]. He also mentioned that he can pick up the letter today.

## 2023-06-11 NOTE — Telephone Encounter (Signed)
 Called and spoke to patient. Verified name and DOB. Informed patient letter was ready for pick up. Patient will come by office and get letter.

## 2024-01-01 ENCOUNTER — Other Ambulatory Visit: Payer: Self-pay | Admitting: Internal Medicine

## 2024-01-26 ENCOUNTER — Other Ambulatory Visit: Payer: Self-pay | Admitting: Internal Medicine

## 2024-01-26 NOTE — Telephone Encounter (Signed)
 Refills has been sent to the pharmacy.

## 2024-01-27 ENCOUNTER — Other Ambulatory Visit: Payer: Self-pay | Admitting: Internal Medicine

## 2024-01-30 ENCOUNTER — Other Ambulatory Visit: Payer: Self-pay | Admitting: Internal Medicine

## 2024-02-27 ENCOUNTER — Other Ambulatory Visit: Payer: Self-pay | Admitting: Internal Medicine

## 2024-03-02 ENCOUNTER — Other Ambulatory Visit: Payer: Self-pay | Admitting: Internal Medicine

## 2024-03-03 NOTE — Telephone Encounter (Signed)
 T of Dr. Mona. Dooes Dr. Mona want to refill this RX? Please advise.

## 2024-03-23 ENCOUNTER — Other Ambulatory Visit: Payer: Self-pay | Admitting: Internal Medicine

## 2024-03-29 ENCOUNTER — Other Ambulatory Visit: Payer: Self-pay | Admitting: Internal Medicine

## 2024-04-21 ENCOUNTER — Other Ambulatory Visit: Payer: Self-pay | Admitting: Internal Medicine

## 2024-04-23 ENCOUNTER — Other Ambulatory Visit: Payer: Self-pay | Admitting: Internal Medicine

## 2024-04-28 ENCOUNTER — Other Ambulatory Visit: Payer: Self-pay | Admitting: Internal Medicine

## 2024-07-05 ENCOUNTER — Ambulatory Visit: Admitting: Internal Medicine
